# Patient Record
Sex: Male | Born: 1990 | Race: Black or African American | Hispanic: No | Marital: Single | Smoking: Never smoker
Health system: Southern US, Community
[De-identification: ages and names within clinical notes are randomized; demographics above are authoritative.]

## PROBLEM LIST (undated history)

## (undated) DIAGNOSIS — K219 Gastro-esophageal reflux disease without esophagitis: Secondary | ICD-10-CM

## (undated) DIAGNOSIS — F419 Anxiety disorder, unspecified: Secondary | ICD-10-CM

## (undated) DIAGNOSIS — Y249XXA Unspecified firearm discharge, undetermined intent, initial encounter: Secondary | ICD-10-CM

## (undated) DIAGNOSIS — W3400XA Accidental discharge from unspecified firearms or gun, initial encounter: Secondary | ICD-10-CM

## (undated) DIAGNOSIS — F32A Depression, unspecified: Secondary | ICD-10-CM

## (undated) HISTORY — DX: Anxiety disorder, unspecified: F41.9

## (undated) HISTORY — DX: Depression, unspecified: F32.A

## (undated) HISTORY — DX: Gastro-esophageal reflux disease without esophagitis: K21.9

---
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
## 2012-05-11 ENCOUNTER — Encounter (HOSPITAL_COMMUNITY): Payer: Self-pay | Admitting: Emergency Medicine

## 2012-05-11 ENCOUNTER — Emergency Department (HOSPITAL_COMMUNITY): Payer: Self-pay

## 2012-05-11 ENCOUNTER — Emergency Department (HOSPITAL_COMMUNITY)
Admission: EM | Admit: 2012-05-11 | Discharge: 2012-05-11 | Disposition: A | Discharge status: Home / Self Care | Payer: Self-pay | Attending: Emergency Medicine | Admitting: Emergency Medicine

## 2012-05-11 DIAGNOSIS — M545 Low back pain, unspecified: Secondary | ICD-10-CM | POA: Insufficient documentation

## 2012-05-11 DIAGNOSIS — M549 Dorsalgia, unspecified: Secondary | ICD-10-CM

## 2012-05-11 MED ORDER — CYCLOBENZAPRINE HCL 10 MG PO TABS: 10.0000 mg | ORAL_TABLET | Freq: Two times a day (BID) | ORAL | Status: DC | PRN

## 2012-05-11 MED ORDER — HYDROCODONE-ACETAMINOPHEN 5-325 MG PO TABS
1.0000 | ORAL_TABLET | Freq: Once | ORAL | Status: AC
  Administered 2012-05-11: 1 via ORAL
  Filled 2012-05-11: qty 1

## 2012-05-11 MED ORDER — NAPROXEN 500 MG PO TABS: 500.0000 mg | ORAL_TABLET | Freq: Two times a day (BID) | ORAL | Status: DC

## 2012-05-11 MED ORDER — HYDROCODONE-ACETAMINOPHEN 5-325 MG PO TABS: 1.0000 | ORAL_TABLET | Freq: Four times a day (QID) | ORAL | Status: DC | PRN

## 2012-05-11 NOTE — ED Notes (Signed)
Pt c/o lower back pain x 3 days; pt denies obvious injury  
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
## 2012-05-16 ENCOUNTER — Telehealth (HOSPITAL_COMMUNITY): Payer: Self-pay | Admitting: Emergency Medicine

## 2012-07-06 ENCOUNTER — Emergency Department (HOSPITAL_COMMUNITY)
Admission: EM | Admit: 2012-07-06 | Discharge: 2012-07-06 | Disposition: A | Discharge status: Home / Self Care | Payer: No Typology Code available for payment source | Attending: Emergency Medicine | Admitting: Emergency Medicine

## 2012-07-06 ENCOUNTER — Encounter (HOSPITAL_COMMUNITY): Payer: Self-pay | Admitting: Emergency Medicine

## 2012-07-06 ENCOUNTER — Emergency Department (HOSPITAL_COMMUNITY): Payer: No Typology Code available for payment source

## 2012-07-06 DIAGNOSIS — S7000XA Contusion of unspecified hip, initial encounter: Secondary | ICD-10-CM | POA: Insufficient documentation

## 2012-07-06 DIAGNOSIS — S7002XA Contusion of left hip, initial encounter: Secondary | ICD-10-CM

## 2012-07-06 DIAGNOSIS — Y92410 Unspecified street and highway as the place of occurrence of the external cause: Secondary | ICD-10-CM | POA: Insufficient documentation

## 2012-07-06 DIAGNOSIS — S5002XA Contusion of left elbow, initial encounter: Secondary | ICD-10-CM

## 2012-07-06 DIAGNOSIS — S50312A Abrasion of left elbow, initial encounter: Secondary | ICD-10-CM

## 2012-07-06 DIAGNOSIS — IMO0002 Reserved for concepts with insufficient information to code with codable children: Secondary | ICD-10-CM | POA: Insufficient documentation

## 2012-07-06 DIAGNOSIS — S5000XA Contusion of unspecified elbow, initial encounter: Secondary | ICD-10-CM | POA: Insufficient documentation

## 2012-07-06 DIAGNOSIS — S60812A Abrasion of left wrist, initial encounter: Secondary | ICD-10-CM

## 2012-07-06 DIAGNOSIS — Y9389 Activity, other specified: Secondary | ICD-10-CM | POA: Insufficient documentation

## 2012-07-06 MED ORDER — NAPROXEN 500 MG PO TABS: 500.0000 mg | ORAL_TABLET | Freq: Two times a day (BID) | ORAL | Status: DC

## 2012-07-06 MED ORDER — IBUPROFEN 400 MG PO TABS
800.0000 mg | ORAL_TABLET | Freq: Once | ORAL | Status: AC
  Administered 2012-07-06: 800 mg via ORAL
  Filled 2012-07-06: qty 2
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
## 2013-05-04 ENCOUNTER — Encounter (HOSPITAL_COMMUNITY): Payer: Self-pay | Admitting: Emergency Medicine

## 2013-05-04 DIAGNOSIS — R110 Nausea: Secondary | ICD-10-CM | POA: Insufficient documentation

## 2013-05-04 NOTE — ED Notes (Signed)
Pt. reports nausea for 4 days , denies emesis , occasional diarrhea , denies fever or chills.

## 2013-05-05 ENCOUNTER — Emergency Department (HOSPITAL_COMMUNITY)
Admission: EM | Admit: 2013-05-05 | Discharge: 2013-05-05 | Disposition: A | Discharge status: Home / Self Care | Payer: No Typology Code available for payment source | Attending: Emergency Medicine | Admitting: Emergency Medicine

## 2013-05-05 DIAGNOSIS — R110 Nausea: Secondary | ICD-10-CM

## 2013-05-05 LAB — COMPREHENSIVE METABOLIC PANEL
ALT: 19 U/L (ref 0–53)
AST: 17 U/L (ref 0–37)
Albumin: 4.2 g/dL (ref 3.5–5.2)
Alkaline Phosphatase: 55 U/L (ref 39–117)
BUN: 13 mg/dL (ref 6–23)
CALCIUM: 9.2 mg/dL (ref 8.4–10.5)
CO2: 25 meq/L (ref 19–32)
CREATININE: 1.05 mg/dL (ref 0.50–1.35)
Chloride: 101 mEq/L (ref 96–112)
GLUCOSE: 96 mg/dL (ref 70–99)
Potassium: 4.2 mEq/L (ref 3.7–5.3)
Sodium: 140 mEq/L (ref 137–147)
Total Bilirubin: 0.5 mg/dL (ref 0.3–1.2)
Total Protein: 6.9 g/dL (ref 6.0–8.3)

## 2013-05-05 LAB — URINALYSIS, ROUTINE W REFLEX MICROSCOPIC
Bilirubin Urine: NEGATIVE
Glucose, UA: NEGATIVE mg/dL
Hgb urine dipstick: NEGATIVE
Ketones, ur: NEGATIVE mg/dL
NITRITE: NEGATIVE
PROTEIN: NEGATIVE mg/dL
SPECIFIC GRAVITY, URINE: 1.02 (ref 1.005–1.030)
UROBILINOGEN UA: 0.2 mg/dL (ref 0.0–1.0)
pH: 6 (ref 5.0–8.0)

## 2013-05-05 LAB — URINE MICROSCOPIC-ADD ON

## 2013-05-05 LAB — CBC WITH DIFFERENTIAL/PLATELET
BASOS ABS: 0 10*3/uL (ref 0.0–0.1)
BASOS PCT: 0 % (ref 0–1)
Eosinophils Absolute: 0.2 10*3/uL (ref 0.0–0.7)
Eosinophils Relative: 2 % (ref 0–5)
HEMATOCRIT: 41.9 % (ref 39.0–52.0)
Hemoglobin: 15 g/dL (ref 13.0–17.0)
LYMPHS ABS: 3 10*3/uL (ref 0.7–4.0)
Lymphocytes Relative: 32 % (ref 12–46)
MCH: 32.3 pg (ref 26.0–34.0)
MCHC: 35.8 g/dL (ref 30.0–36.0)
MCV: 90.3 fL (ref 78.0–100.0)
MONOS PCT: 6 % (ref 3–12)
Monocytes Absolute: 0.6 10*3/uL (ref 0.1–1.0)
NEUTROS ABS: 5.5 10*3/uL (ref 1.7–7.7)
Neutrophils Relative %: 60 % (ref 43–77)
Platelets: 152 10*3/uL (ref 150–400)
RBC: 4.64 MIL/uL (ref 4.22–5.81)
RDW: 12.9 % (ref 11.5–15.5)
WBC: 9.3 10*3/uL (ref 4.0–10.5)

## 2013-05-05 MED ORDER — SODIUM CHLORIDE 0.9 % IV SOLN
1000.0000 mL | Freq: Once | INTRAVENOUS | Status: AC
  Administered 2013-05-05: 1000 mL via INTRAVENOUS

## 2013-05-05 MED ORDER — ONDANSETRON HCL 4 MG/2ML IJ SOLN
4.0000 mg | Freq: Once | INTRAMUSCULAR | Status: AC
  Administered 2013-05-05: 4 mg via INTRAVENOUS
  Filled 2013-05-05: qty 2

## 2013-05-05 MED ORDER — SODIUM CHLORIDE 0.9 % IV SOLN
1000.0000 mL | INTRAVENOUS | Status: DC
  Administered 2013-05-05: 1000 mL via INTRAVENOUS

## 2013-05-05 MED ORDER — METOCLOPRAMIDE HCL 10 MG PO TABS: 10.0000 mg | ORAL_TABLET | Freq: Four times a day (QID) | ORAL | Status: DC | PRN

## 2013-05-05 NOTE — ED Provider Notes (Signed)
CSN: [id number]     Arrival date & time 05/04/13  2326 History   First MD Initiated Contact with Patient 05/05/13 0130     Chief Complaint  Patient presents with  . Nausea   (Consider location/radiation/quality/duration/timing/severity/associated sxs/prior Treatment) The history is provided by the patient.   23 year old male complains of nausea for the last 4 days. Appetite has been normal and he has not vomited. He has had some loose bowel movements. He states his urine has been darker than normal. He denies fever, chills, sweats. He denies any abdominal pain or myalgias. He may have had some sick contacts. He has not done anything to treat his symptoms. Nothing makes it better and nothing makes it worse.  History reviewed. No pertinent past medical history. History reviewed. No pertinent past surgical history. No family history on file. History  Substance Use Topics  . Smoking status: Never Smoker   . Smokeless tobacco: Not on file  . Alcohol Use: Yes     Comment: occ    Review of Systems  All other systems reviewed and are negative.    Allergies  Review of patient's allergies indicates no known allergies.  Home Medications   Current Outpatient Rx  Name  Route  Sig  Dispense  Refill  . Aspirin-Salicylamide-Caffeine (BC HEADACHE POWDER PO)   Oral   Take 1 Package by mouth every 8 (eight) hours as needed (pain).          BP 118/68  Pulse 55  Temp(Src) 97.4 F (36.3 C) (Oral)  Resp 16  SpO2 100% Physical Exam  Nursing note and vitals reviewed.  23 year old male, resting comfortably and in no acute distress. Vital signs are significant for bradycardia with heart rate 55. Oxygen saturation is 100%, which is normal. Head is normocephalic and atraumatic. PERRLA, EOMI. Oropharynx is clear. There is no scleral icterus. Neck is nontender and supple without adenopathy or JVD. Back is nontender and there is no CVA tenderness. Lungs are clear without rales, wheezes, or  rhonchi. Chest is nontender. Heart has regular rate and rhythm without murmur. Abdomen is soft, flat, nontender without masses or hepatosplenomegaly and peristalsis is normoactive. Extremities have no cyanosis or edema, full range of motion is present. Skin is warm and dry without rash. Neurologic: Mental status is normal, cranial nerves are intact, there are no motor or sensory deficits.  ED Course  Procedures (including critical care time) Labs Review Results for orders placed during the hospital encounter of 05/05/13  CBC WITH DIFFERENTIAL      Result Value Range   WBC 9.3  4.0 - 10.5 K/uL   RBC 4.64  4.22 - 5.81 MIL/uL   Hemoglobin 15.0  13.0 - 17.0 g/dL   HCT 23.741.9  62.839.0 - 31.552.0 %   MCV 90.3  78.0 - 100.0 fL   MCH 32.3  26.0 - 34.0 pg   MCHC 35.8  30.0 - 36.0 g/dL   RDW 17.612.9  16.011.5 - 73.715.5 %   Platelets 152  150 - 400 K/uL   Neutrophils Relative % 60  43 - 77 %   Lymphocytes Relative 32  12 - 46 %   Monocytes Relative 6  3 - 12 %   Eosinophils Relative 2  0 - 5 %   Basophils Relative 0  0 - 1 %   Neutro Abs 5.5  1.7 - 7.7 K/uL   Lymphs Abs 3.0  0.7 - 4.0 K/uL   Monocytes Absolute 0.6  0.1 -  1.0 K/uL   Eosinophils Absolute 0.2  0.0 - 0.7 K/uL   Basophils Absolute 0.0  0.0 - 0.1 K/uL  COMPREHENSIVE METABOLIC PANEL      Result Value Range   Sodium 140  137 - 147 mEq/L   Potassium 4.2  3.7 - 5.3 mEq/L   Chloride 101  96 - 112 mEq/L   CO2 25  19 - 32 mEq/L   Glucose, Bld 96  70 - 99 mg/dL   BUN 13  6 - 23 mg/dL   Creatinine, Ser 4.09  0.50 - 1.35 mg/dL   Calcium 9.2  8.4 - 81.1 mg/dL   Total Protein 6.9  6.0 - 8.3 g/dL   Albumin 4.2  3.5 - 5.2 g/dL   AST 17  0 - 37 U/L   ALT 19  0 - 53 U/L   Alkaline Phosphatase 55  39 - 117 U/L   Total Bilirubin 0.5  0.3 - 1.2 mg/dL   GFR calc non Af Amer >90  >90 mL/min   GFR calc Af Amer >90  >90 mL/min  URINALYSIS, ROUTINE W REFLEX MICROSCOPIC      Result Value Range   Color, Urine YELLOW  YELLOW   APPearance CLEAR  CLEAR    Specific Gravity, Urine 1.020  1.005 - 1.030   pH 6.0  5.0 - 8.0   Glucose, UA NEGATIVE  NEGATIVE mg/dL   Hgb urine dipstick NEGATIVE  NEGATIVE   Bilirubin Urine NEGATIVE  NEGATIVE   Ketones, ur NEGATIVE  NEGATIVE mg/dL   Protein, ur NEGATIVE  NEGATIVE mg/dL   Urobilinogen, UA 0.2  0.0 - 1.0 mg/dL   Nitrite NEGATIVE  NEGATIVE   Leukocytes, UA TRACE (*) NEGATIVE  URINE MICROSCOPIC-ADD ON      Result Value Range   Squamous Epithelial / LPF RARE  RARE   WBC, UA 7-10  <3 WBC/hpf   RBC / HPF 0-2  <3 RBC/hpf   Bacteria, UA FEW (*) RARE   MDM   1. Nausea    Nausea of uncertain cause. CBC and complete metabolic panel are normal. He'll be given IV fluids and IV ondansetron and urinalysis will be checked.  Urinalysis shows very questionable urinary tract infection. There is slight pyuria with a few bacteria. In the absence of urinary symptoms, I will wait for culture read for it to decide whether he should go on antibiotics. He is discharged with prescription for metoclopramide for nausea.  Dione Booze, MD 05/05/13 [phone]

## 2013-05-05 NOTE — ED Notes (Signed)
C/o nausea x 3-4 days, + diarrhea 3 times daily x 2 days.  Urine dark in color despite drinking water.  Denies pain, just nausea.

## 2013-05-05 NOTE — ED Notes (Signed)
Pt up to BR to void; feeling much improved.

## 2013-05-05 NOTE — ED Notes (Signed)
Pt back to bed.  Much improved.  Ready to go home.

## 2013-05-05 NOTE — Discharge Instructions (Signed)
Your urine showed a possible infection. A culture will be completed in two days - if it shows you need to be on an antibiotic, we will contact you then.  Nausea, Adult Nausea is the feeling that you have an upset stomach or have to vomit. Nausea by itself is not likely a serious concern, but it may be an early sign of more serious medical problems. As nausea gets worse, it can lead to vomiting. If vomiting develops, there is the risk of dehydration.  CAUSES   Viral infections.  Food poisoning.  Medicines.  Pregnancy.  Motion sickness.  Migraine headaches.  Emotional distress.  Severe pain from any source.  Alcohol intoxication. HOME CARE INSTRUCTIONS  Get plenty of rest.  Ask your caregiver about specific rehydration instructions.  Eat small amounts of food and sip liquids more often.  Take all medicines as told by your caregiver. SEEK MEDICAL CARE IF:  You have not improved after 2 days, or you get worse.  You have a headache. SEEK IMMEDIATE MEDICAL CARE IF:   You have a fever.  You faint.  You keep vomiting or have blood in your vomit.  You are extremely weak or dehydrated.  You have dark or bloody stools.  You have severe chest or abdominal pain. MAKE SURE YOU:  Understand these instructions.  Will watch your condition.  Will get help right away if you are not doing well or get worse. Document Released: 05/02/2004 Document Revised: 12/18/2011 Document Reviewed: 12/05/2010 [facility] Patient Information 2014 [place], [place].  Metoclopramide tablets What is this medicine? METOCLOPRAMIDE (met oh kloe PRA mide) is used to treat the symptoms of gastroesophageal reflux disease (GERD) like heartburn. It is also used to treat people with slow emptying of the stomach and intestinal tract. This medicine may be used for other purposes; ask your health care provider or pharmacist if you have questions. COMMON BRAND NAME(S): Reglan What should I tell my health  care provider before I take this medicine? They need to know if you have any of these conditions: -breast cancer -depression -diabetes -heart failure -high blood pressure -kidney disease -liver disease -Parkinson's disease or a movement disorder -pheochromocytoma -seizures -stomach obstruction, bleeding, or perforation -an unusual or allergic reaction to metoclopramide, procainamide, sulfites, other medicines, foods, dyes, or preservatives -pregnant or trying to get pregnant -breast-feeding How should I use this medicine? Take this medicine by mouth with a glass of water. Follow the directions on the prescription label. Take this medicine on an empty stomach, about 30 minutes before eating. Take your doses at regular intervals. Do not take your medicine more often than directed. Do not stop taking except on the advice of your doctor or health care professional. A special MedGuide will be given to you by the pharmacist with each prescription and refill. Be sure to read this information carefully each time. Talk to your pediatrician regarding the use of this medicine in children. Special care may be needed. Overdosage: If you think you have taken too much of this medicine contact a poison control center or emergency room at once. NOTE: This medicine is only for you. Do not share this medicine with others. What if I miss a dose? If you miss a dose, take it as soon as you can. If it is almost time for your next dose, take only that dose. Do not take double or extra doses. What may interact with this medicine? -acetaminophen -cyclosporine -digoxin -medicines for blood pressure -medicines for diabetes, including insulin -medicines  for hay fever and other allergies -medicines for depression, especially an Monoamine Oxidase Inhibitor (MAOI) -medicines for Parkinson's disease, like levodopa -medicines for sleep or for pain -tetracycline This list may not describe all possible interactions.  Give your health care provider a list of all the medicines, herbs, non-prescription drugs, or dietary supplements you use. Also tell them if you smoke, drink alcohol, or use illegal drugs. Some items may interact with your medicine. What should I watch for while using this medicine? It may take a few weeks for your stomach condition to start to get better. However, do not take this medicine for longer than 12 weeks. The longer you take this medicine, and the more you take it, the greater your chances are of developing serious side effects. If you are an elderly patient, a male patient, or you have diabetes, you may be at an increased risk for side effects from this medicine. Contact your doctor immediately if you start having movements you cannot control such as lip smacking, rapid movements of the tongue, involuntary or uncontrollable movements of the eyes, head, arms and legs, or muscle twitches and spasms. Patients and their families should watch out for worsening depression or thoughts of suicide. Also watch out for any sudden or severe changes in feelings such as feeling anxious, agitated, panicky, irritable, hostile, aggressive, impulsive, severely restless, overly excited and hyperactive, or not being able to sleep. If this happens, especially at the beginning of treatment or after a change in dose, call your doctor. Do not treat yourself for high fever. Ask your doctor or health care professional for advice. You may get drowsy or dizzy. Do not drive, use machinery, or do anything that needs mental alertness until you know how this drug affects you. Do not stand or sit up quickly, especially if you are an older patient. This reduces the risk of dizzy or fainting spells. Alcohol can make you more drowsy and dizzy. Avoid alcoholic drinks. What side effects may I notice from receiving this medicine? Side effects that you should report to your doctor or health care professional as soon as  possible: -allergic reactions like skin rash, itching or hives, swelling of the face, lips, or tongue -abnormal production of milk in females -breast enlargement in both males and females -change in the way you walk -difficulty moving, speaking or swallowing -drooling, lip smacking, or rapid movements of the tongue -excessive sweating -fever -involuntary or uncontrollable movements of the eyes, head, arms and legs -irregular heartbeat or palpitations -muscle twitches and spasms -unusually weak or tired Side effects that usually do not require medical attention (report to your doctor or health care professional if they continue or are bothersome): -change in sex drive or performance -depressed mood -diarrhea -difficulty sleeping -headache -menstrual changes -restless or nervous This list may not describe all possible side effects. Call your doctor for medical advice about side effects. You may report side effects to FDA at 1-800-FDA-1088. Where should I keep my medicine? Keep out of the reach of children. Store at room temperature between 20 and 25 degrees C (68 and 77 degrees F). Protect from light. Keep container tightly closed. Throw away any unused medicine after the expiration date. NOTE: This sheet is a summary. It may not cover all possible information. If you have questions about this medicine, talk to your doctor, pharmacist, or health care provider.  2014, [employer]. (2011-07-23 13:04:38)

## 2013-05-06 LAB — URINE CULTURE
Colony Count: NO GROWTH
Culture: NO GROWTH

## 2013-07-01 ENCOUNTER — Encounter (HOSPITAL_COMMUNITY): Payer: Self-pay | Admitting: Emergency Medicine

## 2013-07-01 ENCOUNTER — Emergency Department (HOSPITAL_COMMUNITY)
Admission: EM | Admit: 2013-07-01 | Discharge: 2013-07-01 | Disposition: A | Discharge status: Home / Self Care | Payer: No Typology Code available for payment source | Attending: Emergency Medicine | Admitting: Emergency Medicine

## 2013-07-01 DIAGNOSIS — N390 Urinary tract infection, site not specified: Secondary | ICD-10-CM | POA: Insufficient documentation

## 2013-07-01 DIAGNOSIS — R109 Unspecified abdominal pain: Secondary | ICD-10-CM

## 2013-07-01 LAB — URINALYSIS, ROUTINE W REFLEX MICROSCOPIC
BILIRUBIN URINE: NEGATIVE
GLUCOSE, UA: NEGATIVE mg/dL
HGB URINE DIPSTICK: NEGATIVE
KETONES UR: NEGATIVE mg/dL
Nitrite: NEGATIVE
PH: 6.5 (ref 5.0–8.0)
PROTEIN: NEGATIVE mg/dL
Specific Gravity, Urine: 1.016 (ref 1.005–1.030)
Urobilinogen, UA: 0.2 mg/dL (ref 0.0–1.0)

## 2013-07-01 LAB — COMPREHENSIVE METABOLIC PANEL
ALBUMIN: 4.4 g/dL (ref 3.5–5.2)
ALK PHOS: 57 U/L (ref 39–117)
ALT: 21 U/L (ref 0–53)
AST: 20 U/L (ref 0–37)
BILIRUBIN TOTAL: 0.5 mg/dL (ref 0.3–1.2)
BUN: 12 mg/dL (ref 6–23)
CHLORIDE: 101 meq/L (ref 96–112)
CO2: 28 mEq/L (ref 19–32)
Calcium: 9.8 mg/dL (ref 8.4–10.5)
Creatinine, Ser: 1.03 mg/dL (ref 0.50–1.35)
GFR calc Af Amer: >90 mL/min (ref >90)
GFR calc non Af Amer: >90 mL/min (ref >90)
Glucose, Bld: 94 mg/dL (ref 70–99)
POTASSIUM: 4.6 meq/L (ref 3.7–5.3)
SODIUM: 142 meq/L (ref 137–147)
TOTAL PROTEIN: 7.2 g/dL (ref 6.0–8.3)

## 2013-07-01 LAB — URINE MICROSCOPIC-ADD ON

## 2013-07-01 LAB — CBC WITH DIFFERENTIAL/PLATELET
BASOS PCT: 0 % (ref 0–1)
Basophils Absolute: 0 10*3/uL (ref 0.0–0.1)
EOS ABS: 0.1 10*3/uL (ref 0.0–0.7)
Eosinophils Relative: 1 % (ref 0–5)
HCT: 41.7 % (ref 39.0–52.0)
HEMOGLOBIN: 15 g/dL (ref 13.0–17.0)
LYMPHS ABS: 2.5 10*3/uL (ref 0.7–4.0)
Lymphocytes Relative: 31 % (ref 12–46)
MCH: 32.4 pg (ref 26.0–34.0)
MCHC: 36 g/dL (ref 30.0–36.0)
MCV: 90.1 fL (ref 78.0–100.0)
MONOS PCT: 7 % (ref 3–12)
Monocytes Absolute: 0.5 10*3/uL (ref 0.1–1.0)
NEUTROS ABS: 4.8 10*3/uL (ref 1.7–7.7)
NEUTROS PCT: 61 % (ref 43–77)
PLATELETS: 169 10*3/uL (ref 150–400)
RBC: 4.63 MIL/uL (ref 4.22–5.81)
RDW: 12.8 % (ref 11.5–15.5)
WBC: 7.9 10*3/uL (ref 4.0–10.5)

## 2013-07-01 MED ORDER — PANTOPRAZOLE SODIUM 20 MG PO TBEC: 20.0000 mg | DELAYED_RELEASE_TABLET | Freq: Every day | ORAL | Status: DC

## 2013-07-01 MED ORDER — PANTOPRAZOLE SODIUM 40 MG PO TBEC
40.0000 mg | DELAYED_RELEASE_TABLET | Freq: Once | ORAL | Status: AC
  Administered 2013-07-01: 40 mg via ORAL
  Filled 2013-07-01: qty 1

## 2013-07-01 MED ORDER — DICYCLOMINE HCL 20 MG PO TABS: 20.0000 mg | ORAL_TABLET | Freq: Two times a day (BID) | ORAL | Status: DC

## 2013-07-01 MED ORDER — CEPHALEXIN 500 MG PO CAPS: ORAL_CAPSULE | ORAL | Status: DC

## 2013-07-01 MED ORDER — DICYCLOMINE HCL 10 MG PO CAPS
10.0000 mg | ORAL_CAPSULE | Freq: Once | ORAL | Status: AC
  Administered 2013-07-01: 10 mg via ORAL
  Filled 2013-07-01: qty 1

## 2013-07-01 NOTE — Discharge Instructions (Signed)
Abdominal Pain, Adult Many things can cause belly (abdominal) pain. Most times, the belly pain is not dangerous. Many cases of belly pain can be watched and treated at home. HOME CARE   Do not take medicines that help you go poop (laxatives) unless told to by your doctor.  Only take medicine as told by your doctor.  Eat or drink as told by your doctor. Your doctor will tell you if you should be on a special diet. GET HELP IF:  You do not know what is causing your belly pain.  You have belly pain while you are sick to your stomach (nauseous) or have runny poop (diarrhea).  You have pain while you pee or poop.  Your belly pain wakes you up at night.  You have belly pain that gets worse or better when you eat.  You have belly pain that gets worse when you eat fatty foods. GET HELP RIGHT AWAY IF:   The pain does not go away within 2 hours.  You have a fever.  You keep throwing up (vomiting).  The pain changes and is only in the right or left part of the belly.  You have bloody or tarry looking poop. MAKE SURE YOU:   Understand these instructions.  Will watch your condition.  Will get help right away if you are not doing well or get worse. Document Released: 09/11/2007 Document Revised: 01/13/2013 Document Reviewed: 12/02/2012 [facility] Patient Information 2014 [place]Care, [place]LLC.  Urinary Tract Infection A urinary tract infection (UTI) can occur any place along the urinary tract. The tract includes the kidneys, ureters, bladder, and urethra. A type of germ called bacteria often causes a UTI. UTIs are often helped with antibiotic medicine.  HOME CARE   If given, take antibiotics as told by your doctor. Finish them even if you start to feel better.  Drink enough fluids to keep your pee (urine) clear or pale yellow.  Avoid tea, drinks with caffeine, and bubbly (carbonated) drinks.  Pee often. Avoid holding your pee in for a long time.  Pee before and after having sex  (intercourse).  Wipe from front to back after you poop (bowel movement) if you are a woman. Use each tissue only once. GET HELP RIGHT AWAY IF:   You have back pain.  You have lower belly (abdominal) pain.  You have chills.  You feel sick to your stomach (nauseous).  You throw up (vomit).  Your burning or discomfort with peeing does not go away.  You have a fever.  Your symptoms are not better in 3 days. MAKE SURE YOU:   Understand these instructions.  Will watch your condition.  Will get help right away if you are not doing well or get worse. Document Released: 09/11/2007 Document Revised: 12/18/2011 Document Reviewed: 10/24/2011 [facility] Patient Information 2014 [place]ExitCare, [place]LLC.   Emergency Department Resource Guide 1) Find a Doctor and Pay Out of Pocket Although you won't have to find out who is covered by your insurance plan, it is a good idea to ask around and get recommendations. You will then need to call the office and see if the doctor you have chosen will accept you as a new patient and what types of options they offer for patients who are self-pay. Some doctors offer discounts or will set up payment plans for their patients who do not have insurance, but you will need to ask so you aren't surprised when you get to your appointment.  2) Contact Your Local Health  Department Not all health departments have doctors that can see patients for sick visits, but many do, so it is worth a call to see if yours does. If you don't know where your local health department is, you can check in your phone book. The CDC also has a tool to help you locate your state's health department, and many state websites also have listings of all of their local health departments.  3) Find a Walk-in Clinic If your illness is not likely to be very severe or complicated, you may want to try a walk in clinic. These are popping up all over the country in pharmacies, drugstores, and shopping centers.  They're usually staffed by nurse practitioners or physician assistants that have been trained to treat common illnesses and complaints. They're usually fairly quick and inexpensive. However, if you have serious medical issues or chronic medical problems, these are probably not your best option.  No Primary Care Doctor: - Call Health Connect at  [phone] - they can help you locate a primary care doctor that  accepts your insurance, provides certain services, etc. - Physician Referral Service- [phone]  Chronic Pain Problems: Organization         Address  Phone   Notes  [facility]  [phone] Patients need to be referred by their primary care doctor.   Medication Assistance: Organization         Address  Phone   Notes  [facility] Medication [facility] [address] [place] [place], [place] [address] [phone] --Must be a resident of [facility] -- Must have NO insurance coverage whatsoever (no Medicaid/ Medicare, etc.) -- The pt. MUST have a primary care doctor that directs their care regularly and follows them in the community   MedAssist  [phone]   [employer]  [phone]    Agencies that provide inexpensive medical care: Organization         Address  Phone   Notes  [facility]  [phone]   [facility]    [phone]   [facility] [address] [place], [place] [address] [phone]   [facility] [address] [place] [place] [phone]   Planned Parenthood    [phone]   [facility]    [phone]   [facility]  [address], [place] Phone:  [phone], Fax:  [phone] Hours of Operation:  9 am - 6 pm, M-F.  Also accepts Medicaid/Medicare and self-pay.  [facility]  [address], [place] Phone: [phone], Fax: [phone]. Hours of Operation:  8:30 am - 5:30 pm, M-F.  Also accepts Medicaid and self-pay.  [facility] [place] [address] [place] Phone: [phone]   [facility] [address] Natasha Bence [place], [place] [phone], Ext. [phone] Mondays & Thursdays: 7-9 AM.  First 15 patients are seen on a first come, first serve basis.    Medicaid-accepting [facility] Providers:  Organization         Address  Phone   Notes  [facility] [address], [place] [phone] Also accepts self-pay patients.  [facility] [address] Laurell Josephs [place], [place]  [phone]   [facility] [address], [place] [phone]  [facility] [address] [place] [phone]   Renaye Rakers [address], [place]   [phone] Only accepts [place] patients after they have their name applied to their card.   Self-Pay (no insurance) in [facility]:  Organization         Address  Phone   Notes  Sickle Cell Patients, [facility] [address] [place], [place] [phone]   [facility] [address] [place], [place] [phone]   [facility]  [address], [place] [phone]   [facility]/Dr. Osei-Bonsu  [address], [place] or [address], [place] [phone] Phone number for both [place] and [place] locations is the same.  [facility] [address] [place] [phone]   [facility] [address], [place] or [address] [phone]   [facility] [address], [place] [phone], phone; [phone], fax Sees patients 1st and 3rd Saturday of every month.  Must not qualify for public or private insurance (i.e.  Medicaid, Medicare, [place] Health Choice, Veterans' Benefits)  Household income should be no more than 200% of the poverty level The clinic cannot treat you if you are pregnant or think you are pregnant  Sexually transmitted diseases are not treated at the clinic.    Dental Care: Organization         Address  Phone  Notes  [facility] [address] [place], [place] [phone] Accepts children up to age 60 who are enrolled in [place] or [place] Health Choice; pregnant women with a Medicaid card; and children who have applied for Medicaid or [place] Health Choice, but were declined, whose parents can pay a reduced fee at time of service.  [facility] of [facility]  [address], [place] [phone] Accepts children up to age 45 who are enrolled in [place] or [place] Health Choice; pregnant women with a Medicaid card; and children who have applied for Medicaid or [place] Health Choice, but were declined, whose parents can pay a reduced fee at time of service.  [facility]  [address] [place], [place] [phone] Patients are seen by appointment only. Walk-ins are not accepted. [facility] will see patients 62 years of age and older. Monday - Tuesday (8am-5pm) Most Wednesdays (8:30-5pm) $30 per visit, cash only  [facility]  [address], [facility] [phone] Patients are seen by appointment only. Walk-ins are not accepted. [facility] will see patients 62 years of age and older. One Wednesday Evening (Monthly: Volunteer Based).  $30 per visit, cash only  [employer]  [phone] for adults; Children under age 62, call Graduate Pediatric Dentistry at [phone]. Children aged 66-14, please call [phone] to request a pediatric application.  Dental services are provided in all areas of dental care including fillings,  crowns and bridges, complete and partial dentures, implants, gum treatment, root canals, and extractions. Preventive care is also provided. Treatment is provided to both adults and children. Patients are selected via a lottery and there is often a waiting list.   [facility] [address], [place]  [phone] [email or web address]  [facility] [address], [place], [place] [phone], Ext. [phone] Second and Fourth Thursday of each month, opens at 6:30 AM; Clinic ends at 9 AM.  Patients are seen on a first-come first-served basis, and a limited number are seen during each clinic.   [facility]  [address], [place], [place] [phone]   Eligibility Requirements You must have lived in [place], [place], or [place] counties for at least the last three months.   You cannot be eligible for state or federal sponsored National Cityhealthcare insurance, including CIGNAVeterans Administration, [place]Medicaid, or [employer] EntertainmentMedicare.   You generally cannot be eligible for healthcare insurance through your employer.    How to apply: Eligibility screenings are held every Tuesday and Wednesday afternoon from 1:00 pm until 4:00 pm. You do not need an appointment for the interview!  [facility] [address], [place], [place] [phone]   [facility]  [phone]   [facility]  [phone]   [facility]  [phone]    Behavioral Health Resources in the Community: Intensive Outpatient Programs Organization         Address  Phone  Notes  [facility] [address], [place], [place] [phone]   [facility] Outpatient [address], [place], [place] [phone]   [facility] [address], [place], [place]  [phone]   [facility] [address],  [place], [place] [phone] or [phone]   Substance Abuse  Resources Organization         Address  Phone  Notes  [facility]  [phone]   [facility]  [phone]   [facility]  [phone]   [facility]  [phone]   Residential & Outpatient Substance Abuse Program  [phone]   Psychological Services Organization         Address  Phone  Notes  [facility]  [phone]- [phone]   [facility]  [phone]   [facility] [address], [place] [phone] or [phone]    Mobile Crisis Teams Organization         Address  Phone  Notes  [facility], Mobile Crisis Care Unit  [phone]   [facility]  [address] [place], [place] [phone]   Doristine LocksSharon DeEsch [address] [place] [phone]    Self-Help/Support Groups Organization         Address  Phone             Notes  [facility] [place] - variety of support groups  336- [phone] Call for more information  Narcotics Anonymous (NA), [facility]aring [facility] [address], [place]  2 meetings at this location   Statisticianesidential Treatment Programs Organization         Address  Phone  Notes  [facility] [address],    [place]  [phone]   [facility]  [address], [place] [phone], [place], [place] [phone]   [facility] [address], [place] [phone] Admissions: 8am-3pm M-F  [facility] [address]    [place], [place] [phone]   [facility] [address], [place], [place] [phone]   [facility] [address]  [place], [place] [phone]   [facility] - Intensive Outpatient [phone]  [address], [place], [place] [phone]   [facility] ([facility] [address]  [place], [place] [phone] or [phone]   [facility] ([facility]) [address] [place], [place] [phone] Accepts Medicaid  [facility] [address]  [place] [phone] Substance Abuse/Addiction Treatment   [facility] Resources Organization         Address  Phone  Notes  [facility]  [phone]) [phone]   Angie FavaJulie Brannon, PhD [address], [address] [place], [place]   [phone](336) [phone] or [phone](336) [phone]   [facility]   [address] [place], [place] [phone](336) [phone]   [facility] [address], [place], [place] [phone] Insurance/Medicaid/sponsorship through [facility]  [facility] [address]                                    [place], [place] [phone] Therapy/tele-psych/case  [facility] [address].   [place], [place] [phone]) [phone]    Dr. Lolly MustacheArfeen  [phone]   [facility]  [employer] [facility] 1) [address], [place] 2) [address], [place] 3)  [address], [place] [phone]) [phone]  [phone]   [facility] Child Abuse Hotline [phone] or [phone]) [phone] (After Hours)

## 2013-07-01 NOTE — ED Provider Notes (Signed)
CSN: [id number]     Arrival date & time 07/01/13  1804 History   First MD Initiated Contact with Patient 07/01/13 2011     Chief Complaint  Patient presents with  . Abdominal Pain     (Consider location/radiation/quality/duration/timing/severity/associated sxs/prior Treatment) HPI Comments: Patient is a 23 year old male presents today with 4 months of abdominal pain. He reports that it is a burning sensation worse early in the morning in his epigastric area. The pain does not radiate. He has associated nausea without vomiting. He denies any diarrhea. He was given a medication at the end of January which helped. He believes this medication was naproxen, however it appears in his chart that it was Reglan. He does not believe that food triggers his pain. The pain does seem to be worse when he is hungry. He denies any black or tarry stools. No bright red blood per rectum. He has associated burning with urination. He denies urgency frequency. No fevers, chills, shortness of breath, chest pain.   The history is provided by the patient. No language interpreter was used.    History reviewed. No pertinent past medical history. History reviewed. No pertinent past surgical history. History reviewed. No pertinent family history. History  Substance Use Topics  . Smoking status: Never Smoker   . Smokeless tobacco: Not on file  . Alcohol Use: Yes     Comment: occ    Review of Systems  Constitutional: Negative for fever and chills.  Respiratory: Negative for shortness of breath.   Cardiovascular: Negative for chest pain.  Gastrointestinal: Positive for nausea and abdominal pain. Negative for vomiting and diarrhea.  Genitourinary: Positive for dysuria. Negative for urgency and frequency.  All other systems reviewed and are negative.      Allergies  Review of patient's allergies indicates no known allergies.  Home Medications  No current outpatient prescriptions on file. BP 105/59  Pulse 70   Temp(Src) 98.6 F (37 C) (Oral)  Resp 18  SpO2 99% Physical Exam  Nursing note and vitals reviewed. Constitutional: He is oriented to person, place, and time. He appears well-developed and well-nourished.  Non-toxic appearance. He does not have a sickly appearance. He does not appear ill. No distress.  Very well appearing, in no distress.   HENT:  Head: Normocephalic and atraumatic.  Right Ear: External ear normal.  Left Ear: External ear normal.  Nose: Nose normal.  Eyes: Conjunctivae are normal.  Neck: Normal range of motion. No tracheal deviation present.  Cardiovascular: Normal rate, regular rhythm and normal heart sounds.   Pulmonary/Chest: Effort normal and breath sounds normal. No stridor.  Abdominal: Soft. Bowel sounds are normal. He exhibits no distension. There is tenderness in the epigastric area. There is no rigidity, no rebound and no guarding.  Mild epigastric tenderness to deep palpation.   Musculoskeletal: Normal range of motion.  Neurological: He is alert and oriented to person, place, and time.  Skin: Skin is warm and dry. He is not diaphoretic.  Psychiatric: He has a normal mood and affect. His behavior is normal.    ED Course  Procedures (including critical care time) Labs Review Labs Reviewed  URINALYSIS, ROUTINE W REFLEX MICROSCOPIC - Abnormal; Notable for the following:    Leukocytes, UA SMALL (*)    All other components within normal limits  CBC WITH DIFFERENTIAL  COMPREHENSIVE METABOLIC PANEL  URINE MICROSCOPIC-ADD ON   Imaging Review No results found.   EKG Interpretation None      MDM   Final  diagnoses:  Abdominal pain  UTI (lower urinary tract infection)    Patient is an otherwise healthy 23 year old male who presents today for evaluation of 4 months of mild epigastric pain. Patient has mild tenderness to deep palpation today. He is very well appearing. No bloody or black tarry stools. No concern for bleeding PUD or perforation. No  concern for cholecystitis. Abd is soft and non surgical. Patient was given bentyl and protonix. Given resource guide to follow up with PCP. Also given GI follow up. Return instructions given. Vital signs stable for discharge. Patient has dysuria and 7-10 white blood cells seen on UA. Will treat for early UTI. Afebrile, no CVA tenderness. Patient / Family / Caregiver informed of clinical course, understand medical decision-making process, and agree with plan.    Mora BellmanHannah S Nyazia Canevari, PA-C 07/01/13 2201

## 2013-07-01 NOTE — ED Notes (Signed)
Pt reports having abd pain, cramping and nausea, denies vomiting or diarrhea. Has been seen here in past for same, states he was given a medicine that only temporarily relieved the pain. No acute distress noted at triage.

## 2013-07-04 NOTE — ED Provider Notes (Signed)
Medical screening examination/treatment/procedure(s) were performed by non-physician practitioner and as supervising physician I was immediately available for consultation/collaboration.   EKG Interpretation None        Candyce ChurnJohn David Mishell Donalson III, MD 07/04/13 1248

## 2013-09-21 ENCOUNTER — Emergency Department (HOSPITAL_COMMUNITY)
Admission: EM | Admit: 2013-09-21 | Discharge: 2013-09-21 | Disposition: A | Discharge status: Home / Self Care | Payer: Self-pay | Attending: Emergency Medicine | Admitting: Emergency Medicine

## 2013-09-21 ENCOUNTER — Emergency Department (HOSPITAL_COMMUNITY): Payer: Self-pay

## 2013-09-21 ENCOUNTER — Encounter (HOSPITAL_COMMUNITY): Payer: Self-pay | Admitting: Emergency Medicine

## 2013-09-21 DIAGNOSIS — R05 Cough: Secondary | ICD-10-CM | POA: Insufficient documentation

## 2013-09-21 DIAGNOSIS — R079 Chest pain, unspecified: Secondary | ICD-10-CM | POA: Insufficient documentation

## 2013-09-21 DIAGNOSIS — R059 Cough, unspecified: Secondary | ICD-10-CM | POA: Insufficient documentation

## 2013-09-21 LAB — BASIC METABOLIC PANEL
BUN: 11 mg/dL (ref 6–23)
CO2: 27 mEq/L (ref 19–32)
Calcium: 9.3 mg/dL (ref 8.4–10.5)
Chloride: 103 mEq/L (ref 96–112)
Creatinine, Ser: 1.04 mg/dL (ref 0.50–1.35)
GFR calc Af Amer: >90 mL/min (ref >90)
GFR calc non Af Amer: >90 mL/min (ref >90)
Glucose, Bld: 100 mg/dL — ABNORMAL HIGH (ref 70–99)
Potassium: 3.9 mEq/L (ref 3.7–5.3)
Sodium: 142 mEq/L (ref 137–147)

## 2013-09-21 LAB — CBC WITH DIFFERENTIAL/PLATELET
BASOS ABS: 0 10*3/uL (ref 0.0–0.1)
Basophils Relative: 0 % (ref 0–1)
Eosinophils Absolute: 0.2 10*3/uL (ref 0.0–0.7)
Eosinophils Relative: 2 % (ref 0–5)
HEMATOCRIT: 41.8 % (ref 39.0–52.0)
HEMOGLOBIN: 14.5 g/dL (ref 13.0–17.0)
LYMPHS ABS: 1.3 10*3/uL (ref 0.7–4.0)
Lymphocytes Relative: 10 % — ABNORMAL LOW (ref 12–46)
MCH: 31.6 pg (ref 26.0–34.0)
MCHC: 34.7 g/dL (ref 30.0–36.0)
MCV: 91.1 fL (ref 78.0–100.0)
MONOS PCT: 10 % (ref 3–12)
Monocytes Absolute: 1.2 10*3/uL — ABNORMAL HIGH (ref 0.1–1.0)
NEUTROS ABS: 9.9 10*3/uL — AB (ref 1.7–7.7)
Neutrophils Relative %: 78 % — ABNORMAL HIGH (ref 43–77)
Platelets: 150 10*3/uL (ref 150–400)
RBC: 4.59 MIL/uL (ref 4.22–5.81)
RDW: 12.6 % (ref 11.5–15.5)
WBC: 12.6 10*3/uL — AB (ref 4.0–10.5)

## 2013-09-21 LAB — I-STAT TROPONIN, ED: Troponin i, poc: 0 ng/mL (ref 0.00–0.08)

## 2013-09-21 NOTE — ED Provider Notes (Signed)
CSN: [id number]     Arrival date & time 09/21/13  1043 History   First MD Initiated Contact with Patient 09/21/13 1144     Chief Complaint  Patient presents with  . Chest Pain      Patient is a 23 y.o. male presenting with chest pain. The history is provided by the patient.  Chest Pain Pain location:  R chest Pain quality: pressure and sharp   Pain radiates to:  Does not radiate Pain radiates to the back: no   Pain severity:  Moderate Duration:  3 days Timing:  Intermittent Progression:  Improving Relieved by:  Rest Worsened by:  Deep breathing and exertion Associated symptoms: cough   Associated symptoms: no syncope   pt reports he has had CP for past 3 days Worse with walking up stairs but also with deep breathing No SOB at rest He reports cough without hemoptysis No fever is reported No syncope He denies medical conditions   No h/o DVT/PE   PMH - none Soc hx - denies cocaine use History reviewed. No pertinent past surgical history. History reviewed. No pertinent family history. History  Substance Use Topics  . Smoking status: Never Smoker   . Smokeless tobacco: Not on file  . Alcohol Use: Yes     Comment: occ    Review of Systems  Respiratory: Positive for cough.   Cardiovascular: Positive for chest pain. Negative for syncope.  All other systems reviewed and are negative.     Allergies  Review of patient's allergies indicates no known allergies.  Home Medications   Prior to Admission medications   Not on File   BP 111/75  Pulse 53  Temp(Src) 98.3 F (36.8 C) (Oral)  Resp 15  Wt 143 lb (64.864 kg)  SpO2 100% Physical Exam CONSTITUTIONAL: Well developed/well nourished HEAD: Normocephalic/atraumatic EYES: EOMI/PERRL ENMT: Mucous membranes moist NECK: supple no meningeal signs SPINE:entire spine nontender CV: S1/S2 noted, no murmurs/rubs/gallops noted LUNGS: Lungs are clear to auscultation bilaterally, no apparent distress ABDOMEN: soft,  nontender, no rebound or guarding GU:no cva tenderness NEURO: Pt is awake/alert, moves all extremitiesx4 EXTREMITIES: pulses normal, full ROM, no LE edema or calf tenderness SKIN: warm, color normal PSYCH: no abnormalities of mood noted  ED Course  Procedures   Low suspicion for ACS/PE/Dissection He is well appearing, comfortable watching TV Appears PERC negative Stable for d/c home Labs Review Labs Reviewed  CBC WITH DIFFERENTIAL - Abnormal; Notable for the following:    WBC 12.6 (*)    Neutrophils Relative % 78 (*)    Neutro Abs 9.9 (*)    Lymphocytes Relative 10 (*)    Monocytes Absolute 1.2 (*)    All other components within normal limits  BASIC METABOLIC PANEL - Abnormal; Notable for the following:    Glucose, Bld 100 (*)    All other components within normal limits  I-STAT TROPOININ, ED    Imaging Review Dg Chest 2 View  09/21/2013   CLINICAL DATA:  Chest pain  EXAM: CHEST  2 VIEW  COMPARISON:  None.  FINDINGS: Lungs are clear. Heart size and pulmonary vascularity are normal. No adenopathy. No pneumothorax. No bone lesions.  IMPRESSION: No abnormality noted.   Electronically Signed   By: Bretta BangWilliam  Woodruff M.D.   On: 09/21/2013 13:08     EKG Interpretation   Date/Time:  Tuesday September 21 2013 12:50:35 EDT Ventricular Rate:  67 PR Interval:  98 QRS Duration: 86 QT Interval:  390 QTC Calculation: 412 R  Axis:   90 Text Interpretation:  Sinus rhythm with sinus arrhythmia with short PR  Rightward axis Borderline ECG Confirmed by Bebe ShaggyWICKLINE  MD, Dorinda HillNALD ([id number]) on  09/21/2013 1:02:15 PM      MDM   Final diagnoses:  Chest pain    Nursing notes including past medical history and social history reviewed and considered in documentation xrays reviewed and considered Labs/vital reviewed and considered     Joya Gaskinsonald W Motty Borin, MD 09/21/13 1539

## 2013-09-21 NOTE — ED Notes (Signed)
Patient transported to X-ray 

## 2013-09-21 NOTE — ED Notes (Signed)
Per pt sts he has been having chest congestion, nasal congestion with pain over the past few days. sts green mucous from nose.

## 2013-09-21 NOTE — Discharge Instructions (Signed)

## 2014-05-30 ENCOUNTER — Encounter (HOSPITAL_BASED_OUTPATIENT_CLINIC_OR_DEPARTMENT_OTHER): Payer: Self-pay | Admitting: *Deleted

## 2014-05-30 ENCOUNTER — Emergency Department (HOSPITAL_BASED_OUTPATIENT_CLINIC_OR_DEPARTMENT_OTHER)
Admission: EM | Admit: 2014-05-30 | Discharge: 2014-05-31 | Disposition: A | Discharge status: Home / Self Care | Payer: Self-pay | Attending: Emergency Medicine | Admitting: Emergency Medicine

## 2014-05-30 DIAGNOSIS — Z0000 Encounter for general adult medical examination without abnormal findings: Secondary | ICD-10-CM | POA: Insufficient documentation

## 2014-05-30 DIAGNOSIS — Z139 Encounter for screening, unspecified: Secondary | ICD-10-CM

## 2014-05-30 LAB — URINE MICROSCOPIC-ADD ON

## 2014-05-30 LAB — URINALYSIS, ROUTINE W REFLEX MICROSCOPIC
Bilirubin Urine: NEGATIVE
GLUCOSE, UA: NEGATIVE mg/dL
Hgb urine dipstick: NEGATIVE
Ketones, ur: NEGATIVE mg/dL
Nitrite: NEGATIVE
PROTEIN: NEGATIVE mg/dL
Specific Gravity, Urine: 1.028 (ref 1.005–1.030)
Urobilinogen, UA: 1 mg/dL (ref 0.0–1.0)
pH: 6.5 (ref 5.0–8.0)

## 2014-05-30 NOTE — ED Notes (Addendum)
Wants to be checked for std, denies any sx ? Girlfriend on meds for herpes but she denies having it

## 2014-05-30 NOTE — ED Notes (Signed)
Pt requesting STD check girlfriend has herpes

## 2014-05-31 NOTE — ED Provider Notes (Signed)
CSN: [id number]     Arrival date & time 05/30/14  2240 History   First MD Initiated Contact with Patient 05/30/14 2357     Chief Complaint  Patient presents with  . Exposure to STD   Curtis [name] is a 24 y.o. male who presents the emergency department with his girlfriend requesting to be checked for STDs. He reports his girlfriend was screened for HSV and tested positive for one of them and he is concerned he might have herpes. He denies any exposure to genital lesions. He denies having any genital lesions. The girlfriend denies ever having any genital lesions or herpes. The girlfriend is not being treated for herpes. The patient denies any symptoms of an STD. He denies fevers, penile discharge, penile pain, scrotal pain, testicular pain, genital lesions, rashes, or dysuria. He reports he was last screened for STDs 02/2014 which was negative. He reports only having intercourse with his girlfriend since this time. He denies multiple sexual partners. He refuses GU exam as he is not having any symptoms.   (Consider location/radiation/quality/duration/timing/severity/associated sxs/prior Treatment) HPI  History reviewed. No pertinent past medical history. History reviewed. No pertinent past surgical history. History reviewed. No pertinent family history. History  Substance Use Topics  . Smoking status: Never Smoker   . Smokeless tobacco: Not on file  . Alcohol Use: Yes     Comment: occ    Review of Systems  Constitutional: Negative for fever.  HENT: Negative for congestion, mouth sores and sore throat.   Respiratory: Negative for cough, shortness of breath and wheezing.   Gastrointestinal: Negative for nausea, vomiting, abdominal pain and diarrhea.  Genitourinary: Negative for dysuria, urgency, frequency, hematuria, discharge, penile swelling, scrotal swelling, difficulty urinating, genital sores, penile pain and testicular pain.  Musculoskeletal: Negative for back pain and neck pain.   Skin: Negative for color change, rash and wound.  Neurological: Negative for headaches.      Allergies  Review of patient's allergies indicates no known allergies.  Home Medications   Prior to Admission medications   Not on File   BP 117/69 mmHg  Pulse 66  Temp(Src) 98.4 F (36.9 C)  Resp 16  Ht 5\' 7"  (1.702 m)  Wt 160 lb (72.576 kg)  BMI 25.05 kg/m2  SpO2 100% Physical Exam  Constitutional: He appears well-developed and well-nourished. No distress.  HENT:  Head: Normocephalic and atraumatic.  Eyes: Right eye exhibits no discharge. Left eye exhibits no discharge.  Cardiovascular: Normal rate, regular rhythm, normal heart sounds and intact distal pulses.   Pulmonary/Chest: Effort normal. No respiratory distress.  Abdominal: Soft. Bowel sounds are normal. There is no tenderness.  Genitourinary:  Patient refuses any GU exam as he states he is not having symptoms.   Neurological: He is alert. Coordination normal.  Skin: No rash noted. He is not diaphoretic.  Psychiatric: He has a normal mood and affect. His behavior is normal.  Nursing note and vitals reviewed.   ED Course  Procedures (including critical care time) Labs Review Labs Reviewed  URINALYSIS, ROUTINE W REFLEX MICROSCOPIC - Abnormal; Notable for the following:    Leukocytes, UA SMALL (*)    All other components within normal limits  URINE MICROSCOPIC-ADD ON - Abnormal; Notable for the following:    Bacteria, UA FEW (*)    All other components within normal limits    Imaging Review No results found.   EKG Interpretation None      Filed Vitals:   05/30/14 2244  BP:  117/69  Pulse: 66  Temp: 98.4 F (36.9 C)  Resp: 16  Height:  (1.702 m)  Weight: 160 lb (72.576 kg)  SpO2: 100%     MDM   Final diagnoses:  Encounter for medical screening examination   This is a 24 year old male who presents the emergency department requesting to be checked for STDs. Patient denies any symptoms of  STDs. He reports his girlfriend had a blood test positive for HSV. He is concerned have herpes. The girlfriend denies any symptoms of herpes. The girlfriend is not being treated for herpes. The patient denies any genital lesions or rashes. He denies any penile discharge or dysuria. He denies any abdominal pain. He denies multiple sexual partners. He was last checked for STDs 3 months ago and was negative. He reports only one sexual partner and this time which is his girlfriend. Patient is afebrile and nontoxic-appearing. His abdomen is soft and nontender to palpation. The patient refuses any GU exam as he states he's not having any symptoms. I referred this patient to the health department for STD screening as he is not currently having any symptoms. His urine is negative for infection.       Lawana Chambers, PA-C 05/31/14 1610  Derwood Kaplan, MD 05/31/14 [phone]

## 2014-05-31 NOTE — Discharge Instructions (Signed)
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
## 2015-12-07 ENCOUNTER — Emergency Department (HOSPITAL_BASED_OUTPATIENT_CLINIC_OR_DEPARTMENT_OTHER)
Admission: EM | Admit: 2015-12-07 | Discharge: 2015-12-07 | Disposition: A | Discharge status: Home / Self Care | Payer: Self-pay | Attending: Emergency Medicine | Admitting: Emergency Medicine

## 2015-12-07 ENCOUNTER — Encounter (HOSPITAL_BASED_OUTPATIENT_CLINIC_OR_DEPARTMENT_OTHER): Payer: Self-pay | Admitting: Emergency Medicine

## 2015-12-07 DIAGNOSIS — X58XXXA Exposure to other specified factors, initial encounter: Secondary | ICD-10-CM | POA: Insufficient documentation

## 2015-12-07 DIAGNOSIS — R634 Abnormal weight loss: Secondary | ICD-10-CM

## 2015-12-07 DIAGNOSIS — S39012A Strain of muscle, fascia and tendon of lower back, initial encounter: Secondary | ICD-10-CM

## 2015-12-07 DIAGNOSIS — Y999 Unspecified external cause status: Secondary | ICD-10-CM | POA: Insufficient documentation

## 2015-12-07 DIAGNOSIS — Y929 Unspecified place or not applicable: Secondary | ICD-10-CM | POA: Insufficient documentation

## 2015-12-07 DIAGNOSIS — R61 Generalized hyperhidrosis: Secondary | ICD-10-CM

## 2015-12-07 DIAGNOSIS — Y939 Activity, unspecified: Secondary | ICD-10-CM | POA: Insufficient documentation

## 2015-12-07 LAB — COMPREHENSIVE METABOLIC PANEL WITH GFR
ALT: 11 U/L — ABNORMAL LOW (ref 17–63)
AST: 14 U/L — ABNORMAL LOW (ref 15–41)
Albumin: 4.6 g/dL (ref 3.5–5.0)
Alkaline Phosphatase: 48 U/L (ref 38–126)
Anion gap: 6 (ref 5–15)
BUN: 11 mg/dL (ref 6–20)
CO2: 26 mmol/L (ref 22–32)
Calcium: 9.6 mg/dL (ref 8.9–10.3)
Chloride: 106 mmol/L (ref 101–111)
Creatinine, Ser: 1.07 mg/dL (ref 0.61–1.24)
GFR calc Af Amer: >60 mL/min (ref >60)
GFR calc non Af Amer: >60 mL/min (ref >60)
Glucose, Bld: 98 mg/dL (ref 65–99)
Potassium: 4.2 mmol/L (ref 3.5–5.1)
Sodium: 138 mmol/L (ref 135–145)
Total Bilirubin: 0.7 mg/dL (ref 0.3–1.2)
Total Protein: 7.1 g/dL (ref 6.5–8.1)

## 2015-12-07 LAB — CBC WITH DIFFERENTIAL/PLATELET
BASOS ABS: 0 10*3/uL (ref 0.0–0.1)
Basophils Relative: 0 %
Eosinophils Absolute: 0.1 10*3/uL (ref 0.0–0.7)
Eosinophils Relative: 1 %
HCT: 42.4 % (ref 39.0–52.0)
HEMOGLOBIN: 15.1 g/dL (ref 13.0–17.0)
LYMPHS ABS: 1.9 10*3/uL (ref 0.7–4.0)
LYMPHS PCT: 26 %
MCH: 32.5 pg (ref 26.0–34.0)
MCHC: 35.6 g/dL (ref 30.0–36.0)
MCV: 91.4 fL (ref 78.0–100.0)
Monocytes Absolute: 0.8 10*3/uL (ref 0.1–1.0)
Monocytes Relative: 10 %
NEUTROS ABS: 4.6 10*3/uL (ref 1.7–7.7)
NEUTROS PCT: 63 %
PLATELETS: 136 10*3/uL — AB (ref 150–400)
RBC: 4.64 MIL/uL (ref 4.22–5.81)
RDW: 12.3 % (ref 11.5–15.5)
WBC: 7.4 10*3/uL (ref 4.0–10.5)

## 2015-12-07 LAB — RAPID HIV SCREEN (HIV 1/2 AB+AG)
HIV 1/2 Antibodies: NONREACTIVE
HIV-1 P24 Antigen - HIV24: NONREACTIVE

## 2015-12-07 LAB — URINALYSIS, ROUTINE W REFLEX MICROSCOPIC
Bilirubin Urine: NEGATIVE
Glucose, UA: NEGATIVE mg/dL
HGB URINE DIPSTICK: NEGATIVE
KETONES UR: NEGATIVE mg/dL
Leukocytes, UA: NEGATIVE
NITRITE: NEGATIVE
PH: 6.5 (ref 5.0–8.0)
Protein, ur: NEGATIVE mg/dL
SPECIFIC GRAVITY, URINE: 1.013 (ref 1.005–1.030)

## 2015-12-07 MED ORDER — IBUPROFEN 200 MG PO TABS
600.0000 mg | ORAL_TABLET | Freq: Once | ORAL | Status: AC
  Administered 2015-12-07: 600 mg via ORAL
  Filled 2015-12-07: qty 1

## 2015-12-07 MED ORDER — CYCLOBENZAPRINE HCL 5 MG PO TABS
5.0000 mg | ORAL_TABLET | Freq: Once | ORAL | Status: AC
  Administered 2015-12-07: 5 mg via ORAL
  Filled 2015-12-07: qty 1

## 2015-12-07 MED ORDER — CYCLOBENZAPRINE HCL 5 MG PO TABS
5.0000 mg | ORAL_TABLET | Freq: Three times a day (TID) | ORAL | 0 refills | Status: DC | PRN
Start: 2015-12-07 — End: 2015-12-08

## 2015-12-07 NOTE — ED Notes (Signed)
MD at bedside. 

## 2015-12-07 NOTE — Discharge Instructions (Signed)
Take motrin 600 mg every 6 hrs for pain .  Take flexeril for back spasms.   Stay hydrated.   You need to see Dr. Pearletha ForgeHudnall regarding your back.   [facility] for follow up regarding your night sweat and weight loss.   Return to ER if you have worse back pain, weakness, numbness, fevers, vomiting.

## 2015-12-07 NOTE — ED Provider Notes (Signed)
[facility]   CSN: [id number] Arrival date & time: 12/07/15  0919     History   Chief Complaint Chief Complaint  Patient presents with  . Night Sweats    HPI Curtis [name] is a 25 y.o. male otherwise healthy here with night sweats, weight loss, back pain. Patient states that he has been having night sweats that intermittently going on for the last 3 years. States that he does wear much clothes when he sleeps and does not put on a heavy blanket but still smokes occasionally. Denies any fevers or chills or cough. Patient also states that he's been losing weight gradually. He was last seen here in 2016 and was 160 ibs and is 146 ibs today. Denies abdominal pain or vomiting. Also has intermittent back spasms, denies injuries. Had previous visit for back pain but didn't see a specialist. He states that his father has night sweats and he has no PCP currently.   The history is provided by the patient.    History reviewed. No pertinent past medical history.  There are no active problems to display for this patient.   History reviewed. No pertinent surgical history.     Home Medications    Prior to Admission medications   Not on File    Family History History reviewed. No pertinent family history.  Social History Social History  Substance Use Topics  . Smoking status: Never Smoker  . Smokeless tobacco: Never Used  . Alcohol use Not on file     Comment: occ     Allergies   Review of patient's allergies indicates no known allergies.   Review of Systems Review of Systems  Constitutional: Positive for unexpected weight change.       Night sweats   Musculoskeletal: Positive for back pain.  All other systems reviewed and are negative.    Physical Exam Updated Vital Signs BP 124/87 (BP Location: Right Arm)   Pulse (!) 55   Temp 98.4 F (36.9 C) (Oral)   Resp 16   Ht 5\' 7"  (1.702 m)   Wt 146 lb (66.2 kg)   SpO2 100%   BMI 22.87 kg/m    Physical Exam  Constitutional: He is oriented to person, place, and time.  Thin, well appearing, well hydrated   HENT:  Head: Normocephalic.  Eyes: EOM are normal. Pupils are equal, round, and reactive to light.  Neck: Normal range of motion. Neck supple.  Cardiovascular: Normal rate, regular rhythm and normal heart sounds.   Pulmonary/Chest: Effort normal and breath sounds normal. No respiratory distress. He has no wheezes. He has no rales.  Abdominal: Soft. Bowel sounds are normal. He exhibits no distension. There is no tenderness. There is no guarding.  Musculoskeletal: Normal range of motion.  No midline tenderness, ? Mild paralumbar tenderness.   Neurological: He is alert and oriented to person, place, and time.  No saddle anesthesia, nl strength bilateral lower extremities. Nl gait   Skin: Skin is warm.  Psychiatric: He has a normal mood and affect.  Nursing note and vitals reviewed.    ED Treatments / Results  Labs (all labs ordered are listed, but only abnormal results are displayed) Labs Reviewed  CBC WITH DIFFERENTIAL/PLATELET - Abnormal; Notable for the following:       Result Value   Platelets 136 (*)    All other components within normal limits  COMPREHENSIVE METABOLIC PANEL - Abnormal; Notable for the following:    AST 14 (*)  ALT 11 (*)    All other components within normal limits  URINALYSIS, ROUTINE W REFLEX MICROSCOPIC (NOT AT [facility])  RAPID HIV SCREEN (HIV 1/2 AB+AG)    EKG  EKG Interpretation None       Radiology No results found.  Procedures Procedures (including critical care time)  Medications Ordered in ED Medications  ibuprofen (ADVIL,MOTRIN) tablet 600 mg (600 mg Oral Given 12/07/15 1031)  cyclobenzaprine (FLEXERIL) tablet 5 mg (5 mg Oral Given 12/07/15 1031)     Initial Impression / Assessment and Plan / ED Course  I have reviewed the triage vital signs and the nursing notes.  Pertinent labs & imaging results that were available  during my care of the patient were reviewed by me and considered in my medical decision making (see chart for details).  Clinical Course   Curtis Organaran D [name] is a 25 y.o. male here with night sweats, back pain, weight loss. Vitals stable, afebrile. Symptoms for several years and doesn't have PCP. Lungs clear, abdomen nontender. No recent incarceration or TB risk factors. Will get CBC, CMP, rapid HIV, UA. Low suspicion for lymphoma or leukemia as he has no fever. Back pain is likely muscle strain and he is neurovascular intact and will not need imaging. Can give motrin and flexeril. If labs and UA unremarkable, he should follow up with a primary care doctor and get further workup as needed.   11:06 AM CBC nl. HIV neg. UA unremarkable. Will refer to ortho for back strain. Will have [facility] for follow up.    Final Clinical Impressions(s) / ED Diagnoses   Final diagnoses:  None    New Prescriptions New Prescriptions   No medications on file     Charlynne Panderavid Hsienta Yao, MD 12/07/15 [phone]

## 2015-12-08 ENCOUNTER — Ambulatory Visit (INDEPENDENT_AMBULATORY_CARE_PROVIDER_SITE_OTHER): Payer: Self-pay | Admitting: Family Medicine

## 2015-12-08 ENCOUNTER — Ambulatory Visit (HOSPITAL_BASED_OUTPATIENT_CLINIC_OR_DEPARTMENT_OTHER)
Admission: RE | Admit: 2015-12-08 | Discharge: 2015-12-08 | Disposition: A | Discharge status: Home / Self Care | Payer: Self-pay | Source: Ambulatory Visit | Attending: Family Medicine | Admitting: Family Medicine

## 2015-12-08 ENCOUNTER — Encounter: Payer: Self-pay | Admitting: Family Medicine

## 2015-12-08 VITALS — BP 109/72 | HR 62 | Ht 67.0 in | Wt 139.0 lb

## 2015-12-08 DIAGNOSIS — M79604 Pain in right leg: Secondary | ICD-10-CM

## 2015-12-08 DIAGNOSIS — M545 Low back pain: Secondary | ICD-10-CM

## 2015-12-08 DIAGNOSIS — M79605 Pain in left leg: Secondary | ICD-10-CM

## 2015-12-08 MED ORDER — MELOXICAM 15 MG PO TABS: 15.0000 mg | ORAL_TABLET | Freq: Every day | ORAL | 2 refills | Status: DC

## 2015-12-08 MED ORDER — CYCLOBENZAPRINE HCL 5 MG PO TABS: 5.0000 mg | ORAL_TABLET | Freq: Three times a day (TID) | ORAL | 0 refills | Status: DC | PRN

## 2015-12-08 MED FILL — CYCLOBENZAPRINE 5 MG TABLET: 5 | 20 days supply | Qty: 60 | Fill #0

## 2015-12-08 MED FILL — MELOXICAM 15 MG TABLET: 15 | 30 days supply | Qty: 30 | Fill #0

## 2015-12-08 NOTE — Patient Instructions (Signed)
You have lumbar radiculopathy (a pinched nerve in your low back). A prednisone dose pack is the best option for immediate relief and may be prescribed. Start meloxicam 15mg  daily with food for pain and inflammation. Flexeril as needed for muscle spasms (no driving on this medicine if it makes you sleepy). Stay as active as possible. Physical therapy has been shown to be helpful as well. Strengthening of low back muscles, abdominal musculature are key for long term pain relief. If not improving, will consider further imaging (MRI). Call us when the cone coverage goes through to start physical therapy.

## 2015-12-13 DIAGNOSIS — M545 Low back pain, unspecified: Secondary | ICD-10-CM | POA: Insufficient documentation
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
## 2015-12-13 NOTE — Assessment & Plan Note (Signed)
describes radiculopathy though exam is reassuring.  Independently reviewed radiographs and no spondy seen or other abnormalities.  He will start with meloxicam, take flexeril as needed.  Paperwork given for cone coverage.  He will consider prednisone, MRI, physical therapy depending on how he's doing.

## 2015-12-23 ENCOUNTER — Encounter (HOSPITAL_BASED_OUTPATIENT_CLINIC_OR_DEPARTMENT_OTHER): Payer: Self-pay | Admitting: *Deleted

## 2015-12-23 DIAGNOSIS — K029 Dental caries, unspecified: Secondary | ICD-10-CM | POA: Insufficient documentation

## 2015-12-23 DIAGNOSIS — Z79899 Other long term (current) drug therapy: Secondary | ICD-10-CM | POA: Insufficient documentation

## 2015-12-23 NOTE — ED Triage Notes (Signed)
Dental pain-requesting antibiotics prior to have dental procedure done for cavity.

## 2015-12-24 ENCOUNTER — Emergency Department (HOSPITAL_BASED_OUTPATIENT_CLINIC_OR_DEPARTMENT_OTHER)
Admission: EM | Admit: 2015-12-24 | Discharge: 2015-12-24 | Disposition: A | Discharge status: Home / Self Care | Payer: Self-pay | Attending: Emergency Medicine | Admitting: Emergency Medicine

## 2015-12-24 DIAGNOSIS — K029 Dental caries, unspecified: Secondary | ICD-10-CM

## 2015-12-24 MED ORDER — NAPROXEN 500 MG PO TABS: 500.0000 mg | ORAL_TABLET | Freq: Two times a day (BID) | ORAL | 0 refills | Status: DC

## 2015-12-24 MED ORDER — PENICILLIN V POTASSIUM 500 MG PO TABS: 500.0000 mg | ORAL_TABLET | Freq: Four times a day (QID) | ORAL | 0 refills | Status: AC

## 2015-12-24 NOTE — ED Notes (Signed)
Pt given d/c instructions as per chart. Rx x 2. Verbalizes understanding. No questions. 

## 2015-12-24 NOTE — Discharge Instructions (Signed)
Take antibiotics as prescribed. See your dentist as scheduled.

## 2015-12-24 NOTE — ED Provider Notes (Signed)
[facility]   CSN: [id number] Arrival date & time: 12/23/15  2307     History   Chief Complaint Chief Complaint  Patient presents with  . Dental Pain    HPI  Curtis [name] is an 25 y.o. male with no significant PMH who presents to the ED for prescription for antibiotics prior to a dental procedure. He states he is seeing a dentist who is planning on repairing a cavity and extracting a tooth next week. However, he states when he made the appointment the dentist told him to come to the ER for a prescription for antibiotics. States the pain is coming from his wisdom teeth. Taking Goody's intermittently with mild relief of pain. No fever, nausea, vomiting, dysphagia.  History reviewed. No pertinent past medical history.  Patient Active Problem List   Diagnosis Date Noted  . Low back pain radiating to both legs 12/13/2015    History reviewed. No pertinent surgical history.     Home Medications    Prior to Admission medications   Medication Sig Start Date End Date Taking? Authorizing Provider  cyclobenzaprine (FLEXERIL) 10 MG tablet Take 10 mg by mouth 3 (three) times daily as needed for muscle spasms.   Yes Historical Provider, MD  meloxicam (MOBIC) 15 MG tablet Take 1 tablet (15 mg total) by mouth daily. 12/08/15  Yes Lenda KelpShane R Hudnall, MD  naproxen (NAPROSYN) 500 MG tablet Take 1 tablet (500 mg total) by mouth 2 (two) times daily. 12/24/15   Ace GinsSerena Y Geriann Lafont, PA-C  penicillin v potassium (VEETID) 500 MG tablet Take 1 tablet (500 mg total) by mouth 4 (four) times daily. 12/24/15 12/31/15  Carlene CoriaSerena Y Kamylle Axelson, PA-C    Family History History reviewed. No pertinent family history.  Social History Social History  Substance Use Topics  . Smoking status: Never Smoker  . Smokeless tobacco: Never Used  . Alcohol use No     Comment: occ     Allergies   Review of patient's allergies indicates no known allergies.   Review of Systems Review of Systems 10 Systems  reviewed and are negative for acute change except as noted in the HPI.  Physical Exam Updated Vital Signs BP 117/72 (BP Location: Left Arm)   Pulse 78   Temp 98 F (36.7 C) (Oral)   Resp 18   Ht 5\' 7"  (1.702 m)   Wt 63.5 kg   SpO2 100%   BMI 21.93 kg/m   Physical Exam  Constitutional: He is oriented to person, place, and time. No distress.  HENT:  Head: Atraumatic.  Right Ear: External ear normal.  Left Ear: External ear normal.  Nose: Nose normal.  Partially erupted lower 3rd molars with no gross abscess and mild surrounding gingival inflammation.  Eyes: Conjunctivae are normal. No scleral icterus.  Cardiovascular: Normal rate and regular rhythm.   Pulmonary/Chest: Effort normal. No respiratory distress.  Abdominal: He exhibits no distension.  Neurological: He is alert and oriented to person, place, and time.  Skin: Skin is warm and dry. He is not diaphoretic.  Psychiatric: He has a normal mood and affect. His behavior is normal.  Nursing note and vitals reviewed.    ED Treatments / Results  Labs (all labs ordered are listed, but only abnormal results are displayed) Labs Reviewed - No data to display  EKG  EKG Interpretation None       Radiology No results found.  Procedures Procedures (including critical care time)  Medications Ordered in ED  Medications - No data to display   Initial Impression / Assessment and Plan / ED Course  I have reviewed the triage vital signs and the nursing notes.  Pertinent labs & imaging results that were available during my care of the patient were reviewed by me and considered in my medical decision making (see chart for details).  Clinical Course   Pt has dentist appt next week. Rx given for Pen VK. Naproxen as needed for pain. Pt afebrile hemodynamically stable and nontoxic appearing. ER return precautions given.  Final Clinical Impressions(s) / ED Diagnoses   Final diagnoses:  Dental caries    New  Prescriptions New Prescriptions   NAPROXEN (NAPROSYN) 500 MG TABLET    Take 1 tablet (500 mg total) by mouth 2 (two) times daily.   PENICILLIN V POTASSIUM (VEETID) 500 MG TABLET    Take 1 tablet (500 mg total) by mouth 4 (four) times daily.     Carlene Coria, PA-C 12/24/15 0037    Paula Libra, MD 12/24/15 [phone]

## 2015-12-24 NOTE — ED Notes (Signed)
Pt states his right lower molar has been hurting off and on for awhile. Today bit into sandwich and it started hurting worse.

## 2016-06-17 ENCOUNTER — Encounter (HOSPITAL_BASED_OUTPATIENT_CLINIC_OR_DEPARTMENT_OTHER): Payer: Self-pay | Admitting: *Deleted

## 2016-06-17 ENCOUNTER — Emergency Department (HOSPITAL_BASED_OUTPATIENT_CLINIC_OR_DEPARTMENT_OTHER): Payer: PRIVATE HEALTH INSURANCE

## 2016-06-17 ENCOUNTER — Emergency Department (HOSPITAL_BASED_OUTPATIENT_CLINIC_OR_DEPARTMENT_OTHER)
Admission: EM | Admit: 2016-06-17 | Discharge: 2016-06-17 | Disposition: A | Discharge status: Home / Self Care | Payer: PRIVATE HEALTH INSURANCE | Attending: Emergency Medicine | Admitting: Emergency Medicine

## 2016-06-17 DIAGNOSIS — R05 Cough: Secondary | ICD-10-CM | POA: Diagnosis present

## 2016-06-17 DIAGNOSIS — J069 Acute upper respiratory infection, unspecified: Secondary | ICD-10-CM | POA: Insufficient documentation

## 2016-06-17 DIAGNOSIS — Z79899 Other long term (current) drug therapy: Secondary | ICD-10-CM | POA: Insufficient documentation

## 2016-06-17 MED ORDER — GUAIFENESIN-DM 100-10 MG/5ML PO SYRP: 5.0000 mL | ORAL_SOLUTION | Freq: Three times a day (TID) | ORAL | 0 refills | Status: DC | PRN

## 2016-06-17 MED FILL — ROBAFEN-DM SYRUP: 100-10 | 8 days supply | Qty: 118 | Fill #0

## 2016-06-17 NOTE — ED Triage Notes (Signed)
Cough, sneezing, chills and gasping for air x 3 days. He is in no resp distress at triage. Ambulatory and speaking in complete sentences.

## 2016-06-17 NOTE — ED Provider Notes (Signed)
[facility]   CSN: [id number] Arrival date & time: 06/17/16  1231     History   Chief Complaint Chief Complaint  Patient presents with  . Cough    HPI Curtis Organaran D [name] is a 26 y.o. male.  HPI Patient presents with cough and sore throat. Has had it for the last 3 days. Throat is worse with swallowing began first. States he does ache all over. Has had some slight diarrhea. States he coughed up a little blood. Occasional green sputum production, mostly just a dry cough. No definite sick contacts. No abdominal pain. States he does have dull chest pain with the coughing.   History reviewed. No pertinent past medical history.  Patient Active Problem List   Diagnosis Date Noted  . Low back pain radiating to both legs 12/13/2015    History reviewed. No pertinent surgical history.     Home Medications    Prior to Admission medications   Medication Sig Start Date End Date Taking? Authorizing Provider  cyclobenzaprine (FLEXERIL) 10 MG tablet Take 10 mg by mouth 3 (three) times daily as needed for muscle spasms.   Yes Historical Provider, MD  guaiFENesin-dextromethorphan (ROBITUSSIN DM) 100-10 MG/5ML syrup Take 5 mLs by mouth 3 (three) times daily as needed for cough. 06/17/16   Benjiman CoreNathan Kingstyn Deruiter, MD  meloxicam (MOBIC) 15 MG tablet Take 1 tablet (15 mg total) by mouth daily. 12/08/15   Lenda KelpShane R Hudnall, MD  naproxen (NAPROSYN) 500 MG tablet Take 1 tablet (500 mg total) by mouth 2 (two) times daily. 12/24/15   Carlene CoriaSerena Y Sam, PA-C    Family History No family history on file.  Social History Social History  Substance Use Topics  . Smoking status: Never Smoker  . Smokeless tobacco: Never Used  . Alcohol use No     Comment: occ     Allergies   Patient has no known allergies.   Review of Systems Review of Systems  Constitutional: Negative for appetite change.  HENT: Positive for sore throat. Negative for tinnitus.   Eyes: Negative for visual  disturbance.  Respiratory: Positive for cough and shortness of breath.   Cardiovascular: Positive for chest pain.  Gastrointestinal: Negative for abdominal pain.  Endocrine: Negative for polyuria.  Genitourinary: Negative for flank pain.  Musculoskeletal: Positive for myalgias.  Neurological: Negative for weakness.  Hematological: Negative for adenopathy. Does not bruise/bleed easily.  Psychiatric/Behavioral: Negative for confusion.     Physical Exam Updated Vital Signs BP 106/75 (BP Location: Left Arm)   Pulse 69   Temp 98.4 F (36.9 C) (Oral)   Resp 18   Ht 5\' 7"  (1.702 m)   Wt 156 lb (70.8 kg)   SpO2 100%   BMI 24.43 kg/m   Physical Exam  Constitutional: He appears well-developed.  HENT:  Head: Atraumatic.  Posterior pharyngeal erythema without exudate. Uvula midline.  Neck: Neck supple.  Cardiovascular: Normal rate.   Pulmonary/Chest: Effort normal. He has no wheezes. He has no rales.  Abdominal: Soft. There is no tenderness.  Musculoskeletal: He exhibits no edema.  Lymphadenopathy:    He has no cervical adenopathy.  Neurological: He is alert.  Skin: Skin is warm. Capillary refill takes less than 2 seconds.  Psychiatric: He has a normal mood and affect.     ED Treatments / Results  Labs (all labs ordered are listed, but only abnormal results are displayed) Labs Reviewed - No data to display  EKG  EKG Interpretation None  Radiology Dg Chest 2 View  Result Date: 06/17/2016 CLINICAL DATA:  Cough and chest congestion and shortness of breath. EXAM: CHEST  2 VIEW COMPARISON:  09/21/2013 FINDINGS: The heart size and mediastinal contours are within normal limits. Both lungs are clear. The visualized skeletal structures are unremarkable. IMPRESSION: Normal chest. Electronically Signed   By: Francene Boyers M.D.   On: 06/17/2016 13:04    Procedures Procedures (including critical care time)  Medications Ordered in ED Medications - No data to  display   Initial Impression / Assessment and Plan / ED Course  I have reviewed the triage vital signs and the nursing notes.  Pertinent labs & imaging results that were available during my care of the patient were reviewed by me and considered in my medical decision making (see chart for details).     Patient with URI symptoms and sore throat. Clinically does not appear to be strep. X-ray done due to cough and hemoptysis. No other signs that would worry for thrombocytopenia. Discharge home with antitussive.  Final Clinical Impressions(s) / ED Diagnoses   Final diagnoses:  Upper respiratory tract infection, unspecified type    New Prescriptions New Prescriptions   GUAIFENESIN-DEXTROMETHORPHAN (ROBITUSSIN DM) 100-10 MG/5ML SYRUP    Take 5 mLs by mouth 3 (three) times daily as needed for cough.     Benjiman Core, MD 06/17/16 [id number]

## 2016-06-21 ENCOUNTER — Encounter (HOSPITAL_BASED_OUTPATIENT_CLINIC_OR_DEPARTMENT_OTHER): Payer: Self-pay | Admitting: Emergency Medicine

## 2016-06-21 ENCOUNTER — Emergency Department (HOSPITAL_BASED_OUTPATIENT_CLINIC_OR_DEPARTMENT_OTHER)
Admission: EM | Admit: 2016-06-21 | Discharge: 2016-06-21 | Disposition: A | Discharge status: Home / Self Care | Payer: PRIVATE HEALTH INSURANCE | Attending: Emergency Medicine | Admitting: Emergency Medicine

## 2016-06-21 DIAGNOSIS — J209 Acute bronchitis, unspecified: Secondary | ICD-10-CM

## 2016-06-21 DIAGNOSIS — R05 Cough: Secondary | ICD-10-CM | POA: Diagnosis present

## 2016-06-21 DIAGNOSIS — Z79899 Other long term (current) drug therapy: Secondary | ICD-10-CM | POA: Diagnosis not present

## 2016-06-21 MED ORDER — AZITHROMYCIN 250 MG PO TABS: 250.0000 mg | ORAL_TABLET | Freq: Every day | ORAL | 0 refills | Status: DC

## 2016-06-21 MED ORDER — BENZONATATE 100 MG PO CAPS
100.0000 mg | ORAL_CAPSULE | Freq: Three times a day (TID) | ORAL | 0 refills | Status: DC | PRN
Start: 2016-06-21 — End: 2016-07-20

## 2016-06-21 MED FILL — AZITHROMYCIN 250 MG TABLET: 250 | 5 days supply | Qty: 6 | Fill #0

## 2016-06-21 MED FILL — BENZONATATE 100 MG CAP: 100 | 7 days supply | Qty: 21 | Fill #0

## 2016-06-21 NOTE — ED Notes (Signed)
Pt directed to pharmacy to pick up Rx. Work note given 

## 2016-06-21 NOTE — Discharge Instructions (Signed)
Please read instructions below. Talk with your primary care provider about any new medications. Return if you have chest pain, difficulty breathing, uncontrollable fever.

## 2016-06-21 NOTE — ED Provider Notes (Signed)
[facility]   CSN: [id number] Arrival date & time: 06/21/16  1033     History   Chief Complaint Chief Complaint  Patient presents with  . Cough    HPI Curtis [name] is a 26 y.o. male.  Pt presents w recurrent complaint of cough x1wk. Pt was seen on 3/12 and was given dx of bronchitis and sent home w Robitussin DM. Pt reports finishing medication and has had no relief. Reports cough is persistent w some episodes of dark red sputum. Reports dry throat and chest tightness. Denies CP, SOB, pain w breathing, congestion, F/C, abd pain, N/V. Denies hx asthma or DVT. Pt smokes 1-2 cigarettes/day. NKDA.      History reviewed. No pertinent past medical history.  Patient Active Problem List   Diagnosis Date Noted  . Low back pain radiating to both legs 12/13/2015    History reviewed. No pertinent surgical history.     Home Medications    Prior to Admission medications   Medication Sig Start Date End Date Taking? Authorizing Provider  azithromycin (ZITHROMAX) 250 MG tablet Take 1 tablet (250 mg total) by mouth daily. Take first 2 tablets together, then 1 every day until finished. 06/21/16   [place] Nicole Russo, PA-C  benzonatate (TESSALON) 100 MG capsule Take 1 capsule (100 mg total) by mouth 3 (three) times daily as needed for cough. 06/21/16   [place] Nicole Russo, PA-C  cyclobenzaprine (FLEXERIL) 10 MG tablet Take 10 mg by mouth 3 (three) times daily as needed for muscle spasms.    Historical Provider, MD  meloxicam (MOBIC) 15 MG tablet Take 1 tablet (15 mg total) by mouth daily. 12/08/15   Lenda Kelp, MD  naproxen (NAPROSYN) 500 MG tablet Take 1 tablet (500 mg total) by mouth 2 (two) times daily. 12/24/15   Carlene Coria, PA-C    Family History No family history on file.  Social History Social History  Substance Use Topics  . Smoking status: Never Smoker  . Smokeless tobacco: Never Used  . Alcohol use No     Comment: occ     Allergies     Patient has no known allergies.   Review of Systems Review of Systems  Constitutional: Negative for appetite change, chills and fever.  HENT: Negative for congestion, ear pain, sore throat and trouble swallowing.        Reports dry throat.   Respiratory: Positive for cough and chest tightness. Negative for shortness of breath and wheezing.   Cardiovascular: Negative for chest pain.  Gastrointestinal: Negative for abdominal pain, nausea and vomiting.  Musculoskeletal: Negative for myalgias.  All other systems reviewed and are negative.    Physical Exam Updated Vital Signs BP 104/63 (BP Location: Left Arm)   Pulse 69   Temp 98.8 F (37.1 C) (Oral)   Resp 18   Ht 5\' 7"  (1.702 m)   Wt 62.1 kg   SpO2 100%   BMI 21.46 kg/m   Physical Exam  Constitutional: He is oriented to person, place, and time. He appears well-developed and well-nourished.  HENT:  Head: Normocephalic and atraumatic.  Eyes: Conjunctivae are normal.  Neck: Normal range of motion. Neck supple.  Cardiovascular: Normal rate, regular rhythm, normal heart sounds and intact distal pulses.   Pulmonary/Chest: Effort normal and breath sounds normal. No accessory muscle usage. No tachypnea. No respiratory distress. He has no wheezes. He has no rhonchi. He has no rales. He exhibits no tenderness.  Abdominal: Soft. Bowel  sounds are normal. He exhibits no distension and no mass. There is no tenderness.  Neurological: He is alert and oriented to person, place, and time.  Skin: Skin is warm and dry.  Psychiatric: He has a normal mood and affect. His behavior is normal.     ED Treatments / Results  Labs (all labs ordered are listed, but only abnormal results are displayed) Labs Reviewed - No data to display  EKG  EKG Interpretation None       Radiology No results found.  Procedures Procedures (including critical care time)  Medications Ordered in ED Medications - No data to display   Initial Impression  / Assessment and Plan / ED Course  I have reviewed the triage vital signs and the nursing notes.  Pertinent labs & imaging results that were available during my care of the patient were reviewed by me and considered in my medical decision making (see chart for details).     Pt presents w productive cough x1 week, some episodes of dark red sputum. Pt is not having difficulty breathing, no hx DVT. Exam reassuring, no respiratory distress, afebrile, not tachypneic, lungs are clear. CXR from 06/17/16 visit was normal.  PE is doubtful at this time. Cough is not improving x1wk, possible bacterial etiology. Discharge w zithromax and tessalon.  Patient discussed with and seen by Dr. Judd LieneLo.  Discussed results, findings, treatment and follow up. Patient advised of return precautions. Patient verbalized understanding and agreed with plan.   Final Clinical Impressions(s) / ED Diagnoses   Final diagnoses:  Acute bronchitis, unspecified organism    New Prescriptions Discharge Medication List as of 06/21/2016 12:10 PM    START taking these medications   Details  azithromycin (ZITHROMAX) 250 MG tablet Take 1 tablet (250 mg total) by mouth daily. Take first 2 tablets together, then 1 every day until finished., Starting Fri 06/21/2016, Print    benzonatate (TESSALON) 100 MG capsule Take 1 capsule (100 mg total) by mouth 3 (three) times daily as needed for cough., Starting Fri 06/21/2016, Print         [place] Nicole Russo, PA-C 06/21/16 1243    [place] Nicole Ridge[place] CornersRusso, PA-C 06/21/16 1244    Geoffery Lyonsouglas Delo, MD 06/21/16 1504

## 2016-06-21 NOTE — ED Triage Notes (Signed)
Pt reports cough x 1 week and occasional coughing up blood. Pt seen here 3/12 for same and given cough medicine, neg chest xray. Pt reports he has taken all of the cough medicine and symptoms still persist.

## 2016-07-20 ENCOUNTER — Emergency Department (HOSPITAL_BASED_OUTPATIENT_CLINIC_OR_DEPARTMENT_OTHER)
Admission: EM | Admit: 2016-07-20 | Discharge: 2016-07-20 | Disposition: A | Discharge status: Home / Self Care | Payer: PRIVATE HEALTH INSURANCE | Attending: Emergency Medicine | Admitting: Emergency Medicine

## 2016-07-20 ENCOUNTER — Encounter (HOSPITAL_BASED_OUTPATIENT_CLINIC_OR_DEPARTMENT_OTHER): Payer: Self-pay | Admitting: *Deleted

## 2016-07-20 DIAGNOSIS — R39198 Other difficulties with micturition: Secondary | ICD-10-CM | POA: Diagnosis not present

## 2016-07-20 DIAGNOSIS — R339 Retention of urine, unspecified: Secondary | ICD-10-CM | POA: Diagnosis present

## 2016-07-20 LAB — URINALYSIS, ROUTINE W REFLEX MICROSCOPIC
BILIRUBIN URINE: NEGATIVE
Glucose, UA: NEGATIVE mg/dL
Hgb urine dipstick: NEGATIVE
Ketones, ur: NEGATIVE mg/dL
Leukocytes, UA: NEGATIVE
NITRITE: NEGATIVE
Protein, ur: NEGATIVE mg/dL
Specific Gravity, Urine: 1.018 (ref 1.005–1.030)
pH: 7 (ref 5.0–8.0)

## 2016-07-20 NOTE — ED Provider Notes (Addendum)
[facility]   CSN: [id number] Arrival date & time: 07/20/16  1120     History   Chief Complaint Chief Complaint  Patient presents with  . Urinary Retention    HPI Curtis [name] is a 26 y.o. male.  Patient with several week history of urinating difficulty feels is not able to completely empty his bladder. No real pain with urination no blood. No history in his past like this. Denies any fevers new or worse back pain has some mild muscular skeletal back pain. No nausea or vomiting. No abdominal pain.      History reviewed. No pertinent past medical history.  Patient Active Problem List   Diagnosis Date Noted  . Low back pain radiating to both legs 12/13/2015    History reviewed. No pertinent surgical history.     Home Medications    Prior to Admission medications   Not on File    Family History History reviewed. No pertinent family history.  Social History Social History  Substance Use Topics  . Smoking status: Never Smoker  . Smokeless tobacco: Never Used  . Alcohol use No     Comment: occ     Allergies   Patient has no known allergies.   Review of Systems Review of Systems  Constitutional: Negative for fever.  HENT: Negative for congestion.   Eyes: Negative for redness.  Respiratory: Negative for shortness of breath.   Cardiovascular: Negative for chest pain.  Gastrointestinal: Negative for abdominal pain.  Genitourinary: Positive for difficulty urinating. Negative for decreased urine volume, discharge, dysuria, flank pain, frequency, hematuria and urgency.  Musculoskeletal: Negative for back pain.  Skin: Negative for rash.  Neurological: Negative for headaches.  Hematological: Does not bruise/bleed easily.  Psychiatric/Behavioral: Negative for confusion.     Physical Exam Updated Vital Signs BP 122/75 (BP Location: Left Arm)   Pulse 80   Temp 98.4 F (36.9 C) (Oral)   Resp 18   Ht  (1.702 m)   Wt  63.5 kg   SpO2 100%   BMI 21.93 kg/m   Physical Exam  Constitutional: He is oriented to person, place, and time. He appears well-developed and well-nourished. No distress.  HENT:  Head: Normocephalic and atraumatic.  Mouth/Throat: Oropharynx is clear and moist.  Eyes: EOM are normal. Pupils are equal, round, and reactive to light.  Neck: Normal range of motion. Neck supple.  Cardiovascular: Normal rate, regular rhythm and normal heart sounds.   Pulmonary/Chest: Effort normal and breath sounds normal. No respiratory distress.  Abdominal: Soft. Bowel sounds are normal. He exhibits no distension. There is no tenderness.  Genitourinary: Penis normal.  Genitourinary Comments: Circumcised. No discharge no lesions. Testicles descended bilaterally no hernia. External urethral meatus is a bit narrowed no evidence of trauma or injury.  Musculoskeletal: Normal range of motion.  Neurological: He is alert and oriented to person, place, and time. No cranial nerve deficit. He exhibits normal muscle tone. Coordination normal.  Nursing note and vitals reviewed.    ED Treatments / Results  Labs (all labs ordered are listed, but only abnormal results are displayed) Labs Reviewed  URINALYSIS, ROUTINE W REFLEX MICROSCOPIC    EKG  EKG Interpretation None       Radiology No results found.  Procedures Procedures (including critical care time)  Medications Ordered in ED Medications - No data to display   Initial Impression / Assessment and Plan / ED Course  I have reviewed the triage vital signs and  the nursing notes.  Pertinent labs & imaging results that were available during my care of the patient were reviewed by me and considered in my medical decision making (see chart for details).     Patient presents with a complaint of urinary retention or feeling like he can't he or void completely. Been going on for several weeks.  Patient without concerned about STD. No  discharge.  Urinalysis here negative for urinary tract infection. Post voiding bladder scan showed the only 60 mL. So no significant urinary retention.  On exam the urethral meatus is a bit narrowed possible that this could be the problem. Patient states he hasn't had trouble in the past. We'll have him follow-up with urology.  Final Clinical Impressions(s) / ED Diagnoses   Final diagnoses:  Difficulty urinating    New Prescriptions New Prescriptions   No medications on file     Vanetta Mulders, MD 07/20/16 1523    Vanetta Mulders, MD 07/20/16 1525

## 2016-07-20 NOTE — Discharge Instructions (Signed)
Follow-up with urology. No evidence urinary tract infection here today. No significant urinary retention.

## 2016-07-20 NOTE — ED Triage Notes (Signed)
Pt reports urinary retention, frequency x several weeks.  Also reports diarrhea.

## 2016-11-11 ENCOUNTER — Ambulatory Visit (HOSPITAL_COMMUNITY)
Admission: EM | Admit: 2016-11-11 | Discharge: 2016-11-11 | Disposition: A | Discharge status: Home / Self Care | Payer: PRIVATE HEALTH INSURANCE | Attending: Internal Medicine | Admitting: Internal Medicine

## 2016-11-11 ENCOUNTER — Encounter (HOSPITAL_COMMUNITY): Payer: Self-pay | Admitting: Emergency Medicine

## 2016-11-11 DIAGNOSIS — K0889 Other specified disorders of teeth and supporting structures: Secondary | ICD-10-CM

## 2016-11-11 DIAGNOSIS — K047 Periapical abscess without sinus: Secondary | ICD-10-CM

## 2016-11-11 DIAGNOSIS — K029 Dental caries, unspecified: Secondary | ICD-10-CM

## 2016-11-11 MED ORDER — PENICILLIN V POTASSIUM 500 MG PO TABS: 500.0000 mg | ORAL_TABLET | Freq: Four times a day (QID) | ORAL | 0 refills | Status: AC

## 2016-11-11 MED ORDER — KETOROLAC TROMETHAMINE 60 MG/2ML IM SOLN
INTRAMUSCULAR | Status: AC
  Filled 2016-11-11: qty 2

## 2016-11-11 MED ORDER — KETOROLAC TROMETHAMINE 60 MG/2ML IM SOLN
60.0000 mg | Freq: Once | INTRAMUSCULAR | Status: AC
  Administered 2016-11-11: 60 mg via INTRAMUSCULAR

## 2016-11-11 NOTE — ED Triage Notes (Signed)
3 day history of significant tooth pain.  Pain is bottom right.  This episode is worse than in the past.  Patient does not have a dentist.  25 days until insurance kicks in per patient

## 2016-11-11 NOTE — Discharge Instructions (Addendum)
Injection of ketorolac, anti-inflammatory/pain reliever, given at the urgent care today. Prescription for penicillin sent to the pharmacy. Recommend follow-up with a dentist in the next week or so, to discuss definitive management of the tooth.

## 2016-11-11 NOTE — ED Provider Notes (Signed)
[facility]    CSN: [id number] Arrival date & time: 11/11/16  4098     History   Chief Complaint Chief Complaint  Patient presents with  . Dental Pain    HPI Curtis [name] is a 26 y.o. male. He broke the right lower second molar about a month ago, actually had improvement in tooth pain at that time, but in the last 2 or 3 days has had marked increase in pain with throbbing. Subjective facial swelling. No fever. Has been holding off going to the dentist because of insurance issues. Review of the controlled substances Registry reveals that a prescription for oxycodone 30 mg, #120, was filled the patient's name on 10/07/2016.   HPI  No past medical history on file.  Patient Active Problem List   Diagnosis Date Noted  . Low back pain radiating to both legs 12/13/2015    History reviewed. No pertinent surgical history.     Home Medications    Prior to Admission medications   Medication Sig Start Date End Date Taking? Authorizing Provider  penicillin v potassium (VEETID) 500 MG tablet Take 1 tablet (500 mg total) by mouth 4 (four) times daily. 11/11/16 11/21/16  Eustace Moore, MD    Family History No family history on file.  Social History Social History  Substance Use Topics  . Smoking status: Never Smoker  . Smokeless tobacco: Never Used  . Alcohol use No     Comment: occ     Allergies   Patient has no known allergies.   Review of Systems Review of Systems  All other systems reviewed and are negative.    Physical Exam Triage Vital Signs ED Triage Vitals  Enc Vitals Group     BP 11/11/16 1015 129/77     Pulse Rate 11/11/16 1015 (!) 56     Resp 11/11/16 1015 18     Temp 11/11/16 1015 98.6 F (37 C)     Temp Source 11/11/16 1015 Oral     SpO2 11/11/16 1015 100 %     Weight --      Height --      Pain Score 11/11/16 1013 9     Pain Loc --    Updated Vital Signs BP 129/77 (BP Location: Right Arm)   Pulse (!) 56   Temp 98.6 F (37  C) (Oral)   Resp 18   SpO2 100%   Physical Exam  Constitutional: He is oriented to person, place, and time. No distress.  Alert, nicely groomed  HENT:  Head: Atraumatic.  Carious/broken tooth evident in the right lower jaw, second molar No trismus. Palpation of the gingiva is without focal swelling or tenderness.  Eyes:  Conjugate gaze, no eye redness/drainage  Neck: Neck supple.  Cardiovascular: Normal rate.   Pulmonary/Chest: No respiratory distress.  Abdominal: He exhibits no distension.  Musculoskeletal: Normal range of motion.  Neurological: He is alert and oriented to person, place, and time.  Skin: Skin is warm and dry.  No cyanosis  Nursing note and vitals reviewed.    UC Treatments / Results   Procedures Procedures (including critical care time)  Medications Ordered in UC Medications  ketorolac (TORADOL) injection 60 mg (60 mg Intramuscular Given 11/11/16 1054)     Final Clinical Impressions(s) / UC Diagnoses   Final diagnoses:  Pain due to dental caries  Dental infection   Injection of ketorolac, anti-inflammatory/pain reliever, given at the urgent care today. Prescription for penicillin sent to the  pharmacy. Recommend follow-up with a dentist in the next week or so, to discuss definitive management of the tooth.   New Prescriptions Discharge Medication List as of 11/11/2016 10:35 AM    START taking these medications   Details  penicillin v potassium (VEETID) 500 MG tablet Take 1 tablet (500 mg total) by mouth 4 (four) times daily., Starting Mon 11/11/2016, Until Thu 11/21/2016, Normal           Eustace MooreMurray, Darlin Stenseth W, MD 11/18/16 2025

## 2016-11-12 ENCOUNTER — Emergency Department (HOSPITAL_COMMUNITY)
Admission: EM | Admit: 2016-11-12 | Discharge: 2016-11-12 | Discharge status: Against Medical Advice | Payer: PRIVATE HEALTH INSURANCE | Attending: Emergency Medicine | Admitting: Emergency Medicine

## 2016-11-12 ENCOUNTER — Encounter (HOSPITAL_COMMUNITY): Payer: Self-pay | Admitting: Emergency Medicine

## 2016-11-12 DIAGNOSIS — K0889 Other specified disorders of teeth and supporting structures: Secondary | ICD-10-CM | POA: Insufficient documentation

## 2016-11-12 NOTE — ED Triage Notes (Signed)
Pt reports right lower tooth pain for the past few days, [facility] and given a pain injection and abx, states pain meds wore off. States he cannot see a Education officer, communitydentist until insurance kicks in in 25 days.

## 2016-11-27 ENCOUNTER — Emergency Department (HOSPITAL_BASED_OUTPATIENT_CLINIC_OR_DEPARTMENT_OTHER): Payer: Self-pay

## 2016-11-27 ENCOUNTER — Encounter (HOSPITAL_BASED_OUTPATIENT_CLINIC_OR_DEPARTMENT_OTHER): Payer: Self-pay | Admitting: *Deleted

## 2016-11-27 ENCOUNTER — Emergency Department (HOSPITAL_BASED_OUTPATIENT_CLINIC_OR_DEPARTMENT_OTHER)
Admission: EM | Admit: 2016-11-27 | Discharge: 2016-11-27 | Disposition: A | Discharge status: Home / Self Care | Payer: Self-pay | Attending: Emergency Medicine | Admitting: Emergency Medicine

## 2016-11-27 DIAGNOSIS — Y990 Civilian activity done for income or pay: Secondary | ICD-10-CM | POA: Insufficient documentation

## 2016-11-27 DIAGNOSIS — Y9289 Other specified places as the place of occurrence of the external cause: Secondary | ICD-10-CM | POA: Insufficient documentation

## 2016-11-27 DIAGNOSIS — M542 Cervicalgia: Secondary | ICD-10-CM | POA: Insufficient documentation

## 2016-11-27 DIAGNOSIS — S0083XA Contusion of other part of head, initial encounter: Secondary | ICD-10-CM | POA: Insufficient documentation

## 2016-11-27 DIAGNOSIS — Y9389 Activity, other specified: Secondary | ICD-10-CM | POA: Insufficient documentation

## 2016-11-27 MED ORDER — IBUPROFEN 800 MG PO TABS
800.0000 mg | ORAL_TABLET | Freq: Once | ORAL | Status: AC
  Administered 2016-11-27: 800 mg via ORAL
  Filled 2016-11-27: qty 1

## 2016-11-27 MED ORDER — ACETAMINOPHEN 500 MG PO TABS
1000.0000 mg | ORAL_TABLET | Freq: Once | ORAL | Status: AC
  Administered 2016-11-27: 1000 mg via ORAL
  Filled 2016-11-27: qty 2

## 2016-11-27 MED ORDER — OXYCODONE HCL 5 MG PO TABS
5.0000 mg | ORAL_TABLET | Freq: Once | ORAL | Status: AC
  Administered 2016-11-27: 5 mg via ORAL
  Filled 2016-11-27: qty 1
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
## 2016-11-27 NOTE — ED Notes (Signed)
ED Provider at bedside. 

## 2016-11-27 NOTE — ED Notes (Signed)
Patient transported to CT 
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
## 2016-11-27 NOTE — Discharge Instructions (Signed)
Take 4 over the counter ibuprofen tablets 3 times a day or 2 over-the-counter naproxen tablets twice a day for pain. Also take tylenol 1000mg(2 extra strength) four times a day.    

## 2016-12-13 ENCOUNTER — Encounter (HOSPITAL_BASED_OUTPATIENT_CLINIC_OR_DEPARTMENT_OTHER): Payer: Self-pay

## 2016-12-13 ENCOUNTER — Emergency Department (HOSPITAL_BASED_OUTPATIENT_CLINIC_OR_DEPARTMENT_OTHER)
Admission: EM | Admit: 2016-12-13 | Discharge: 2016-12-13 | Disposition: A | Discharge status: Home / Self Care | Payer: PRIVATE HEALTH INSURANCE | Attending: Emergency Medicine | Admitting: Emergency Medicine

## 2016-12-13 DIAGNOSIS — R3911 Hesitancy of micturition: Secondary | ICD-10-CM | POA: Insufficient documentation

## 2016-12-13 LAB — URINALYSIS, MICROSCOPIC (REFLEX)

## 2016-12-13 LAB — URINALYSIS, ROUTINE W REFLEX MICROSCOPIC
Bilirubin Urine: NEGATIVE
Glucose, UA: NEGATIVE mg/dL
HGB URINE DIPSTICK: NEGATIVE
Ketones, ur: NEGATIVE mg/dL
Nitrite: NEGATIVE
PROTEIN: NEGATIVE mg/dL
Specific Gravity, Urine: 1.02 (ref 1.005–1.030)
pH: 6 (ref 5.0–8.0)

## 2016-12-13 NOTE — ED Provider Notes (Signed)
[facility]   CSN: [id number] Arrival date & time: 12/13/16  0036     History   Chief Complaint Chief Complaint  Patient presents with  . Urinary Retention    HPI Curtis [name] is a 26 y.o. male.  HPI Patient feels as though he is not emptying his bladder completely.  This is been going on over the past 12 hours.  He denies dysuria but reports some urinary frequency.  No back pain or flank pain.  No nausea vomiting diarrhea.  No fevers or chills.  No history of urinary retention.  No new medications.   History reviewed. No pertinent past medical history.  Patient Active Problem List   Diagnosis Date Noted  . Low back pain radiating to both legs 12/13/2015    History reviewed. No pertinent surgical history.     Home Medications    Prior to Admission medications   Not on File    Family History No family history on file.  Social History Social History  Substance Use Topics  . Smoking status: Never Smoker  . Smokeless tobacco: Never Used  . Alcohol use No     Comment: occ     Allergies   Patient has no known allergies.   Review of Systems Review of Systems  All other systems reviewed and are negative.    Physical Exam Updated Vital Signs BP 130/78 (BP Location: Left Arm)   Pulse 71   Temp 98.4 F (36.9 C) (Oral)   Resp 16   Ht  (1.702 m)   Wt 70.8 kg (156 lb)   SpO2 100%   BMI 24.43 kg/m   Physical Exam  Constitutional: He is oriented to person, place, and time. He appears well-developed and well-nourished.  HENT:  Head: Normocephalic.  Eyes: EOM are normal.  Neck: Normal range of motion.  Pulmonary/Chest: Effort normal.  Abdominal: Soft. He exhibits no distension. There is no tenderness.  Musculoskeletal: Normal range of motion.  Neurological: He is alert and oriented to person, place, and time.  Psychiatric: He has a normal mood and affect.  Nursing note and vitals reviewed.    ED Treatments /  Results  Labs (all labs ordered are listed, but only abnormal results are displayed) Labs Reviewed  URINALYSIS, ROUTINE W REFLEX MICROSCOPIC - Abnormal; Notable for the following:       Result Value   Leukocytes, UA TRACE (*)    All other components within normal limits  URINALYSIS, MICROSCOPIC (REFLEX) - Abnormal; Notable for the following:    Bacteria, UA FEW (*)    Squamous Epithelial / LPF 0-5 (*)    All other components within normal limits    EKG  EKG Interpretation None       Radiology No results found.  Procedures Procedures (including critical care time)  Medications Ordered in ED Medications - No data to display   Initial Impression / Assessment and Plan / ED Course  I have reviewed the triage vital signs and the nursing notes.  Pertinent labs & imaging results that were available during my care of the patient were reviewed by me and considered in my medical decision making (see chart for details).     No clear etiology for his symptoms.  Well-appearing.  Urine without signs of infection.  Urology follow-up as needed.  Final Clinical Impressions(s) / ED Diagnoses   Final diagnoses:  Urinary hesitancy    New Prescriptions New Prescriptions   No medications on  file     Azalia Bilisampos, Ayanah Snader, MD 12/13/16 [phone]

## 2016-12-13 NOTE — ED Triage Notes (Signed)
Pt c/o burning on urination and bladder fullness when sitting

## 2016-12-13 NOTE — ED Notes (Signed)
Pt verbalizes understanding of d/c instructions and denies any further needs at this time. 

## 2017-01-18 ENCOUNTER — Emergency Department (HOSPITAL_COMMUNITY): Payer: Self-pay

## 2017-01-18 ENCOUNTER — Encounter (HOSPITAL_COMMUNITY): Payer: Self-pay

## 2017-01-18 ENCOUNTER — Emergency Department (HOSPITAL_COMMUNITY)
Admission: EM | Admit: 2017-01-18 | Discharge: 2017-01-18 | Disposition: A | Discharge status: Home / Self Care | Payer: Self-pay | Attending: Emergency Medicine | Admitting: Emergency Medicine

## 2017-01-18 DIAGNOSIS — W3400XA Accidental discharge from unspecified firearms or gun, initial encounter: Secondary | ICD-10-CM | POA: Insufficient documentation

## 2017-01-18 DIAGNOSIS — S81801A Unspecified open wound, right lower leg, initial encounter: Secondary | ICD-10-CM | POA: Insufficient documentation

## 2017-01-18 DIAGNOSIS — Y929 Unspecified place or not applicable: Secondary | ICD-10-CM | POA: Insufficient documentation

## 2017-01-18 DIAGNOSIS — Z23 Encounter for immunization: Secondary | ICD-10-CM | POA: Insufficient documentation

## 2017-01-18 DIAGNOSIS — S81831A Puncture wound without foreign body, right lower leg, initial encounter: Secondary | ICD-10-CM

## 2017-01-18 DIAGNOSIS — Y9389 Activity, other specified: Secondary | ICD-10-CM | POA: Insufficient documentation

## 2017-01-18 DIAGNOSIS — Y998 Other external cause status: Secondary | ICD-10-CM | POA: Insufficient documentation

## 2017-01-18 LAB — I-STAT CG4 LACTIC ACID, ED
LACTIC ACID, VENOUS: 1.09 mmol/L (ref 0.5–1.9)
Lactic Acid, Venous: 3.22 mmol/L (ref 0.5–1.9)

## 2017-01-18 LAB — PROTIME-INR
INR: 0.97
Prothrombin Time: 12.8 seconds (ref 11.4–15.2)

## 2017-01-18 LAB — CBC WITH DIFFERENTIAL/PLATELET
Basophils Absolute: 0 10*3/uL (ref 0.0–0.1)
Basophils Relative: 0 %
Eosinophils Absolute: 0.1 10*3/uL (ref 0.0–0.7)
Eosinophils Relative: 1 %
HEMATOCRIT: 39 % (ref 39.0–52.0)
HEMOGLOBIN: 13.6 g/dL (ref 13.0–17.0)
LYMPHS PCT: 27 %
Lymphs Abs: 2.1 10*3/uL (ref 0.7–4.0)
MCH: 32.2 pg (ref 26.0–34.0)
MCHC: 34.9 g/dL (ref 30.0–36.0)
MCV: 92.4 fL (ref 78.0–100.0)
MONO ABS: 0.7 10*3/uL (ref 0.1–1.0)
MONOS PCT: 10 %
Neutro Abs: 4.7 10*3/uL (ref 1.7–7.7)
Neutrophils Relative %: 62 %
Platelets: 155 10*3/uL (ref 150–400)
RBC: 4.22 MIL/uL (ref 4.22–5.81)
RDW: 12.8 % (ref 11.5–15.5)
WBC: 7.6 10*3/uL (ref 4.0–10.5)

## 2017-01-18 LAB — BASIC METABOLIC PANEL
Anion gap: 11 (ref 5–15)
BUN: 9 mg/dL (ref 6–20)
CALCIUM: 9.1 mg/dL (ref 8.9–10.3)
CO2: 24 mmol/L (ref 22–32)
Chloride: 106 mmol/L (ref 101–111)
Creatinine, Ser: 1.13 mg/dL (ref 0.61–1.24)
GFR calc Af Amer: >60 mL/min (ref >60)
GLUCOSE: 91 mg/dL (ref 65–99)
POTASSIUM: 3.5 mmol/L (ref 3.5–5.1)
Sodium: 141 mmol/L (ref 135–145)

## 2017-01-18 LAB — TYPE AND SCREEN
ABO/RH(D): O POS
Antibody Screen: NEGATIVE

## 2017-01-18 MED ORDER — FENTANYL CITRATE (PF) 100 MCG/2ML IJ SOLN
50.0000 ug | Freq: Once | INTRAMUSCULAR | Status: AC
  Administered 2017-01-18: 50 ug via INTRAVENOUS
  Filled 2017-01-18: qty 2

## 2017-01-18 MED ORDER — BACITRACIN ZINC 500 UNIT/GM EX OINT
1.0000 "application " | TOPICAL_OINTMENT | Freq: Two times a day (BID) | CUTANEOUS | Status: DC
  Administered 2017-01-18: 1 via TOPICAL
  Filled 2017-01-18: qty 1.8

## 2017-01-18 MED ORDER — BACITRACIN ZINC 500 UNIT/GM EX OINT: 1.0000 "application " | TOPICAL_OINTMENT | Freq: Three times a day (TID) | CUTANEOUS | 1 refills | Status: DC

## 2017-01-18 MED ORDER — SODIUM CHLORIDE 0.9 % IV BOLUS (SEPSIS)
1000.0000 mL | Freq: Once | INTRAVENOUS | Status: AC
  Administered 2017-01-18: 1000 mL via INTRAVENOUS

## 2017-01-18 MED ORDER — TETANUS-DIPHTH-ACELL PERTUSSIS 5-2.5-18.5 LF-MCG/0.5 IM SUSP
0.5000 mL | Freq: Once | INTRAMUSCULAR | Status: AC
  Administered 2017-01-18: 0.5 mL via INTRAMUSCULAR
  Filled 2017-01-18: qty 0.5

## 2017-01-18 MED ORDER — NAPROXEN 375 MG PO TABS: 375.0000 mg | ORAL_TABLET | Freq: Two times a day (BID) | ORAL | 0 refills | Status: DC

## 2017-01-18 NOTE — ED Notes (Signed)
Patient transported to X-ray 
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
## 2017-01-19 LAB — ABO/RH: ABO/RH(D): O POS

## 2017-01-21 ENCOUNTER — Telehealth (HOSPITAL_COMMUNITY): Payer: Self-pay

## 2017-01-21 ENCOUNTER — Emergency Department (HOSPITAL_COMMUNITY)
Admission: EM | Admit: 2017-01-21 | Discharge: 2017-01-21 | Disposition: A | Discharge status: Home / Self Care | Payer: Self-pay | Attending: Emergency Medicine | Admitting: Emergency Medicine

## 2017-01-21 ENCOUNTER — Emergency Department (HOSPITAL_COMMUNITY): Payer: Self-pay

## 2017-01-21 ENCOUNTER — Encounter (HOSPITAL_COMMUNITY): Payer: Self-pay

## 2017-01-21 DIAGNOSIS — S71102D Unspecified open wound, left thigh, subsequent encounter: Secondary | ICD-10-CM | POA: Insufficient documentation

## 2017-01-21 DIAGNOSIS — M79605 Pain in left leg: Secondary | ICD-10-CM

## 2017-01-21 DIAGNOSIS — Y929 Unspecified place or not applicable: Secondary | ICD-10-CM | POA: Insufficient documentation

## 2017-01-21 DIAGNOSIS — Y939 Activity, unspecified: Secondary | ICD-10-CM | POA: Insufficient documentation

## 2017-01-21 DIAGNOSIS — Y999 Unspecified external cause status: Secondary | ICD-10-CM | POA: Insufficient documentation

## 2017-01-21 DIAGNOSIS — S71132D Puncture wound without foreign body, left thigh, subsequent encounter: Secondary | ICD-10-CM

## 2017-01-21 DIAGNOSIS — W3400XD Accidental discharge from unspecified firearms or gun, subsequent encounter: Secondary | ICD-10-CM | POA: Insufficient documentation

## 2017-01-21 MED ORDER — CEPHALEXIN 500 MG PO CAPS: 500.0000 mg | ORAL_CAPSULE | Freq: Four times a day (QID) | ORAL | 0 refills | Status: DC
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
## 2017-01-21 NOTE — ED Notes (Signed)
Pt ambulatory to bathroom at this time.
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
## 2017-01-21 NOTE — Discharge Instructions (Signed)
Please complete antibiotics as prescribed. Continue to use Naprosyn for pain. I encouraged you to stay off your feet as much as possible, please wear compression stockings to help with pain and inflammation. Please follow up with the trauma clinic next week. If you developed fevers or chills, worsening redness, leg is persistently numb or you're unable to walk please return to the emergency department.

## 2017-01-21 NOTE — Telephone Encounter (Signed)
[phone]  Curtis [name] states he needs his appointment moved up because he is in extreme pain.  He is in the E.D. right now.
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
## 2017-01-22 NOTE — Telephone Encounter (Signed)
Returned call to patient. Advised that we will have to get procedure approved. Contacted office who is working on this, and they will contact him with appointment. No further needs at this time.

## 2017-11-30 ENCOUNTER — Emergency Department (HOSPITAL_BASED_OUTPATIENT_CLINIC_OR_DEPARTMENT_OTHER)
Admission: EM | Admit: 2017-11-30 | Discharge: 2017-11-30 | Disposition: A | Discharge status: Home / Self Care | Payer: PRIVATE HEALTH INSURANCE | Attending: Emergency Medicine | Admitting: Emergency Medicine

## 2017-11-30 ENCOUNTER — Other Ambulatory Visit: Payer: Self-pay

## 2017-11-30 ENCOUNTER — Encounter (HOSPITAL_BASED_OUTPATIENT_CLINIC_OR_DEPARTMENT_OTHER): Payer: Self-pay | Admitting: *Deleted

## 2017-11-30 DIAGNOSIS — M792 Neuralgia and neuritis, unspecified: Secondary | ICD-10-CM

## 2017-11-30 DIAGNOSIS — M5417 Radiculopathy, lumbosacral region: Secondary | ICD-10-CM | POA: Insufficient documentation

## 2017-11-30 DIAGNOSIS — Z79899 Other long term (current) drug therapy: Secondary | ICD-10-CM | POA: Insufficient documentation

## 2017-11-30 HISTORY — DX: Unspecified firearm discharge, undetermined intent, initial encounter: Y24.9XXA

## 2017-11-30 HISTORY — DX: Accidental discharge from unspecified firearms or gun, initial encounter: W34.00XA

## 2017-11-30 MED ORDER — GABAPENTIN 100 MG PO CAPS: 100.0000 mg | ORAL_CAPSULE | Freq: Three times a day (TID) | ORAL | 0 refills | Status: DC

## 2017-11-30 MED ORDER — NAPROXEN 500 MG PO TABS: 500.0000 mg | ORAL_TABLET | Freq: Two times a day (BID) | ORAL | 0 refills | Status: DC
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
## 2017-11-30 NOTE — Discharge Instructions (Signed)
1.  Schedule appointment with a family doctor to help coordinate your care.  Use the referral number in your discharge instructions to find one. 2.  Follow-up with Dr. [facility].  You may benefit from physical therapy or referral to a spine specialist if needed.
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
## 2017-12-08 ENCOUNTER — Encounter (HOSPITAL_BASED_OUTPATIENT_CLINIC_OR_DEPARTMENT_OTHER): Payer: Self-pay

## 2017-12-08 ENCOUNTER — Emergency Department (HOSPITAL_BASED_OUTPATIENT_CLINIC_OR_DEPARTMENT_OTHER)
Admission: EM | Admit: 2017-12-08 | Discharge: 2017-12-08 | Disposition: A | Discharge status: Home / Self Care | Payer: PRIVATE HEALTH INSURANCE | Attending: Emergency Medicine | Admitting: Emergency Medicine

## 2017-12-08 ENCOUNTER — Other Ambulatory Visit: Payer: Self-pay

## 2017-12-08 DIAGNOSIS — R253 Fasciculation: Secondary | ICD-10-CM | POA: Insufficient documentation

## 2017-12-08 DIAGNOSIS — Z79899 Other long term (current) drug therapy: Secondary | ICD-10-CM | POA: Insufficient documentation

## 2017-12-08 DIAGNOSIS — T887XXA Unspecified adverse effect of drug or medicament, initial encounter: Secondary | ICD-10-CM

## 2017-12-08 DIAGNOSIS — Y69 Unspecified misadventure during surgical and medical care: Secondary | ICD-10-CM | POA: Insufficient documentation
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
## 2017-12-08 NOTE — ED Triage Notes (Signed)
Pt c/o "right arm jumping" x 3 days after starting taking med for "nerve damage" to left LE-NAD-steady gait

## 2017-12-08 NOTE — Discharge Instructions (Signed)
Stop taking the gabapentin and muscle twitching should resolve.

## 2017-12-09 ENCOUNTER — Encounter: Payer: Self-pay | Admitting: Neurology
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
## 2018-01-02 ENCOUNTER — Encounter: Payer: Self-pay | Admitting: Neurology

## 2018-01-02 ENCOUNTER — Ambulatory Visit (INDEPENDENT_AMBULATORY_CARE_PROVIDER_SITE_OTHER): Payer: Self-pay | Admitting: Neurology

## 2018-01-02 VITALS — BP 112/68 | HR 59 | Ht 67.5 in | Wt 145.0 lb

## 2018-01-02 DIAGNOSIS — M792 Neuralgia and neuritis, unspecified: Secondary | ICD-10-CM

## 2018-01-02 DIAGNOSIS — Z029 Encounter for administrative examinations, unspecified: Secondary | ICD-10-CM

## 2018-01-02 MED ORDER — DULOXETINE HCL 30 MG PO CPEP: ORAL_CAPSULE | ORAL | 0 refills | Status: DC

## 2018-01-02 MED ORDER — DULOXETINE HCL 30 MG PO CPEP: 30.0000 mg | ORAL_CAPSULE | Freq: Every day | ORAL | 3 refills | Status: DC

## 2018-01-02 NOTE — Patient Instructions (Signed)
1.  To help with nerve pain, start duloxetine 30mg .  Take 1 capsule daily for 7 days, then increase to 2 capsules daily.  If pain persists in 2 months, contact me and we can increase dose. 2.  Will fill out forms for work.  You may pick it up on Tuesday of next week. 3.  Try using over the counter lidocaine patches 4.  Follow up in 4 months.

## 2018-01-06 ENCOUNTER — Telehealth: Payer: Self-pay

## 2018-01-06 NOTE — Telephone Encounter (Signed)
Called Pt, LMOVM advising paperwork for work is ready for pick up

## 2018-01-14 ENCOUNTER — Other Ambulatory Visit: Payer: Self-pay | Admitting: *Deleted

## 2018-01-14 ENCOUNTER — Telehealth: Payer: Self-pay | Admitting: Neurology

## 2018-01-14 MED ORDER — DULOXETINE HCL 60 MG PO CPEP: ORAL_CAPSULE | ORAL | 5 refills | Status: DC

## 2018-01-14 NOTE — Telephone Encounter (Signed)
Patient states medications Dr.Jaffe prescribed on 9.27.19 were sent to the wrong pharmacy and need them resent to [facility] pharmacy in [place].

## 2018-01-14 NOTE — Telephone Encounter (Signed)
New Rx sent to [facility].

## 2018-01-30 ENCOUNTER — Encounter

## 2018-01-30 ENCOUNTER — Ambulatory Visit: Payer: Self-pay | Admitting: Neurology
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
## 2018-05-04 ENCOUNTER — Ambulatory Visit: Payer: Self-pay | Admitting: Neurology

## 2019-01-12 IMAGING — CR DG CHEST 2V
2 series · 2 of 2 positions shown · non-contrast
Comparison: 09/21/2013

CLINICAL DATA: Cough and chest congestion and shortness of breath.

EXAM:
CHEST  2 VIEW

[w chest pa]
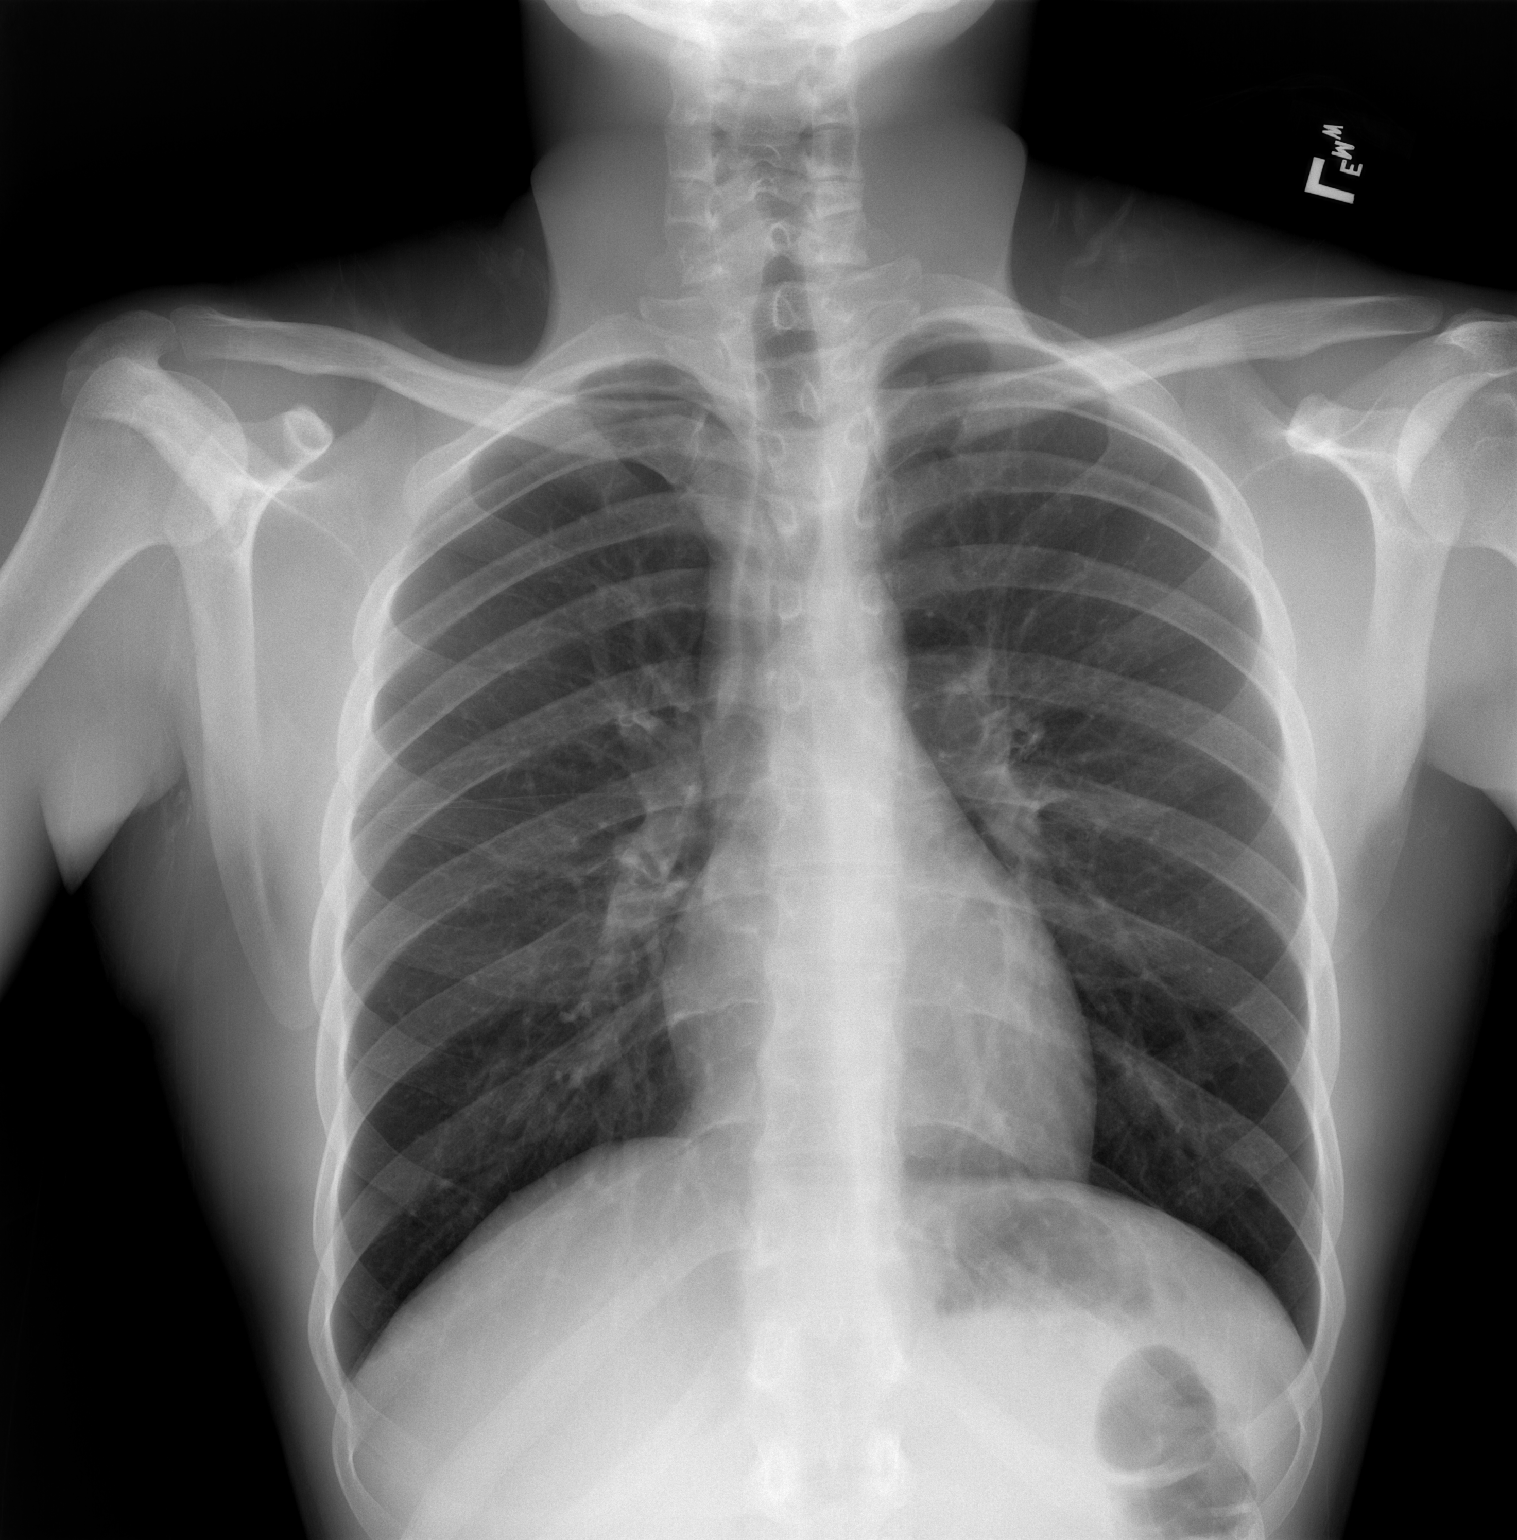

[w chest lat]
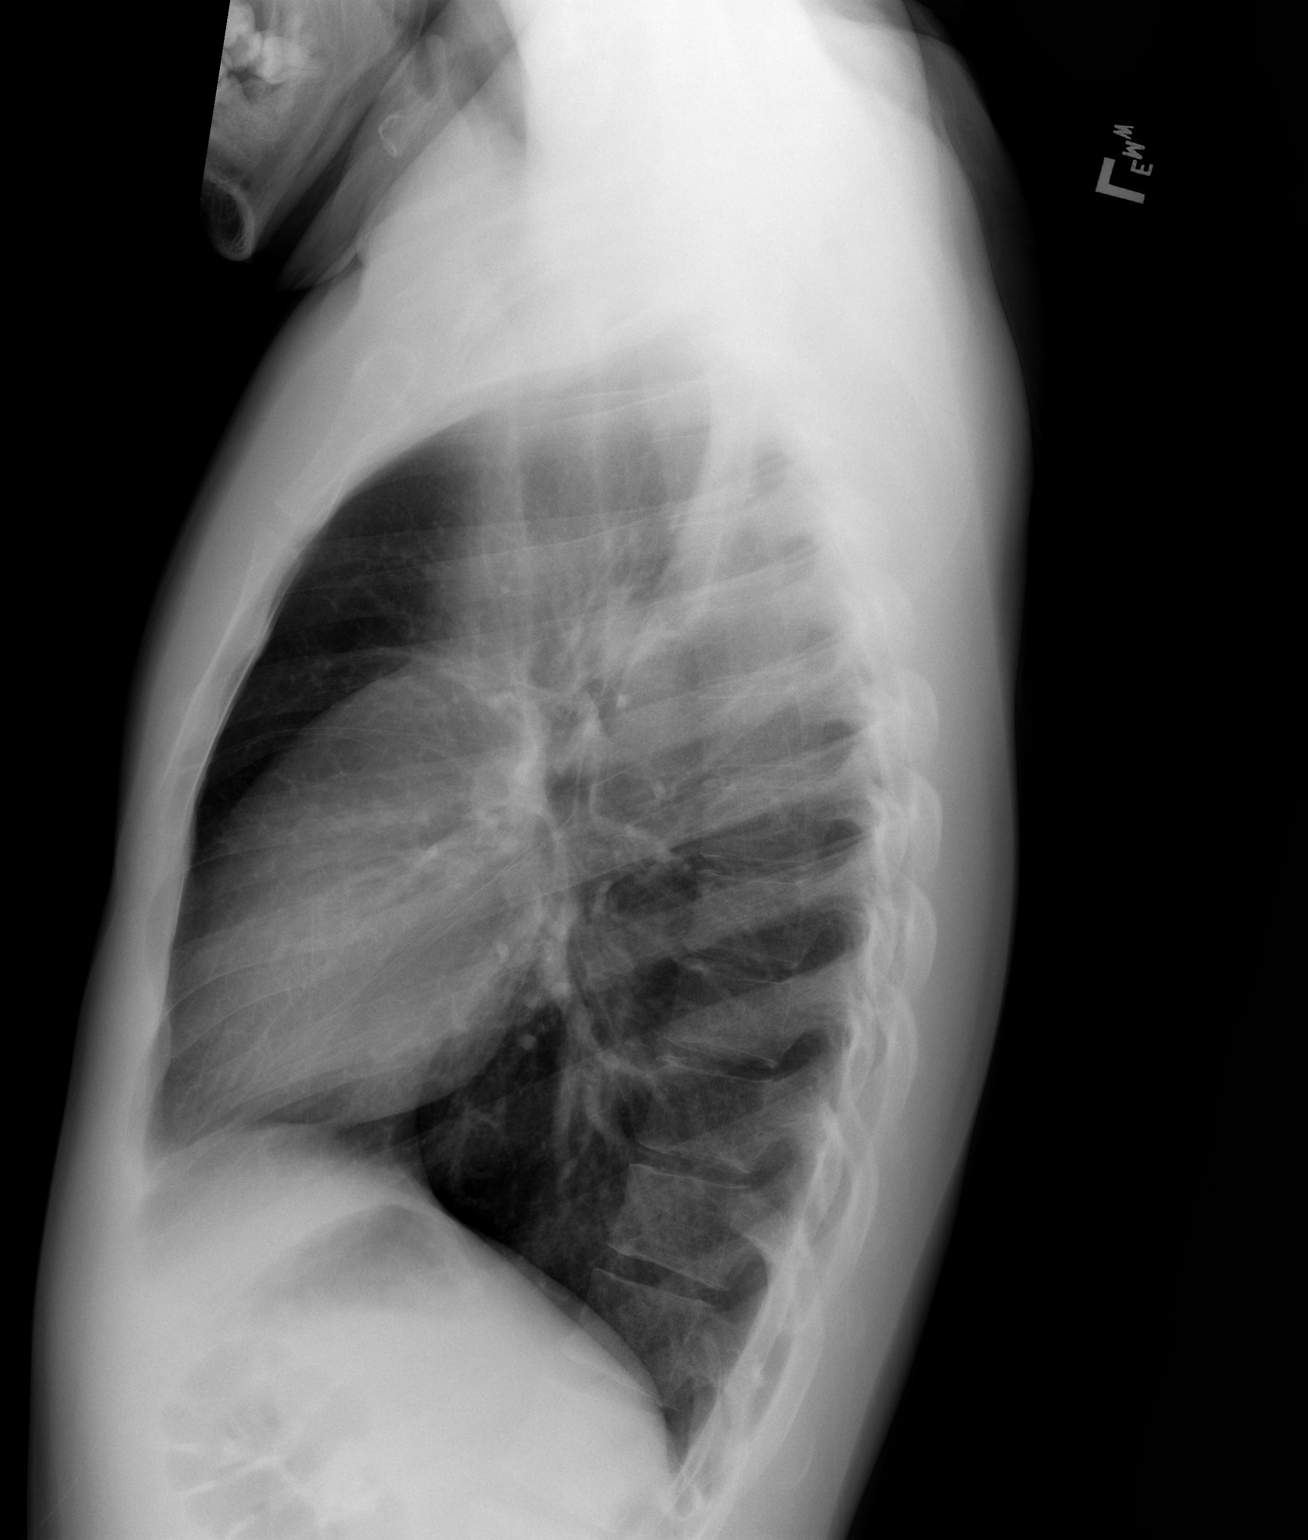

[2 of 2 positions shown; findings below may reference images not displayed]

FINDINGS: The heart size and mediastinal contours are within normal limits.
Both lungs are clear. The visualized skeletal structures are
unremarkable.
IMPRESSION: Normal chest.

## 2019-07-22 ENCOUNTER — Emergency Department (HOSPITAL_BASED_OUTPATIENT_CLINIC_OR_DEPARTMENT_OTHER)
Admission: EM | Admit: 2019-07-22 | Discharge: 2019-07-22 | Disposition: A | Discharge status: Home / Self Care | Payer: Self-pay | Attending: Emergency Medicine | Admitting: Emergency Medicine

## 2019-07-22 ENCOUNTER — Encounter (HOSPITAL_BASED_OUTPATIENT_CLINIC_OR_DEPARTMENT_OTHER): Payer: Self-pay | Admitting: Emergency Medicine

## 2019-07-22 ENCOUNTER — Other Ambulatory Visit: Payer: Self-pay

## 2019-07-22 ENCOUNTER — Emergency Department (HOSPITAL_BASED_OUTPATIENT_CLINIC_OR_DEPARTMENT_OTHER): Payer: Self-pay

## 2019-07-22 DIAGNOSIS — R197 Diarrhea, unspecified: Secondary | ICD-10-CM | POA: Insufficient documentation

## 2019-07-22 DIAGNOSIS — R1033 Periumbilical pain: Secondary | ICD-10-CM | POA: Insufficient documentation

## 2019-07-22 DIAGNOSIS — F1210 Cannabis abuse, uncomplicated: Secondary | ICD-10-CM | POA: Insufficient documentation

## 2019-07-22 DIAGNOSIS — R112 Nausea with vomiting, unspecified: Secondary | ICD-10-CM | POA: Insufficient documentation

## 2019-07-22 LAB — URINALYSIS, ROUTINE W REFLEX MICROSCOPIC
Bilirubin Urine: NEGATIVE
Glucose, UA: NEGATIVE mg/dL
Hgb urine dipstick: NEGATIVE
Ketones, ur: NEGATIVE mg/dL
Leukocytes,Ua: NEGATIVE
Nitrite: NEGATIVE
Protein, ur: NEGATIVE mg/dL
Specific Gravity, Urine: 1.025 (ref 1.005–1.030)
pH: 6.5 (ref 5.0–8.0)

## 2019-07-22 LAB — COMPREHENSIVE METABOLIC PANEL
ALT: 13 U/L (ref 0–44)
AST: 13 U/L — ABNORMAL LOW (ref 15–41)
Albumin: 4.6 g/dL (ref 3.5–5.0)
Alkaline Phosphatase: 46 U/L (ref 38–126)
Anion gap: 8 (ref 5–15)
BUN: 13 mg/dL (ref 6–20)
CO2: 27 mmol/L (ref 22–32)
Calcium: 9.2 mg/dL (ref 8.9–10.3)
Chloride: 107 mmol/L (ref 98–111)
Creatinine, Ser: 1.03 mg/dL (ref 0.61–1.24)
GFR calc Af Amer: >60 mL/min (ref >60)
GFR calc non Af Amer: >60 mL/min (ref >60)
Glucose, Bld: 97 mg/dL (ref 70–99)
Potassium: 4 mmol/L (ref 3.5–5.1)
Sodium: 142 mmol/L (ref 135–145)
Total Bilirubin: 1.2 mg/dL (ref 0.3–1.2)
Total Protein: 7 g/dL (ref 6.5–8.1)

## 2019-07-22 LAB — CBC
HCT: 41.8 % (ref 39.0–52.0)
Hemoglobin: 14.1 g/dL (ref 13.0–17.0)
MCH: 32.4 pg (ref 26.0–34.0)
MCHC: 33.7 g/dL (ref 30.0–36.0)
MCV: 96.1 fL (ref 80.0–100.0)
Platelets: 143 10*3/uL — ABNORMAL LOW (ref 150–400)
RBC: 4.35 MIL/uL (ref 4.22–5.81)
RDW: 12.5 % (ref 11.5–15.5)
WBC: 7.3 10*3/uL (ref 4.0–10.5)
nRBC: 0 % (ref 0.0–0.2)

## 2019-07-22 LAB — LIPASE, BLOOD: Lipase: 23 U/L (ref 11–51)

## 2019-07-22 MED ORDER — PANTOPRAZOLE SODIUM 20 MG PO TBEC: 20.0000 mg | DELAYED_RELEASE_TABLET | Freq: Every day | ORAL | 0 refills | Status: DC

## 2019-07-22 MED ORDER — IOHEXOL 300 MG/ML  SOLN
100.0000 mL | Freq: Once | INTRAMUSCULAR | Status: AC | PRN
  Administered 2019-07-22: 100 mL via INTRAVENOUS

## 2019-07-22 MED ORDER — DICYCLOMINE HCL 20 MG PO TABS: 20.0000 mg | ORAL_TABLET | Freq: Two times a day (BID) | ORAL | 0 refills | Status: DC

## 2019-07-22 MED ORDER — SODIUM CHLORIDE 0.9 % IV BOLUS
1000.0000 mL | Freq: Once | INTRAVENOUS | Status: AC
  Administered 2019-07-22: 11:00:00 1000 mL via INTRAVENOUS

## 2019-07-22 MED ORDER — ONDANSETRON HCL 4 MG/2ML IJ SOLN
4.0000 mg | Freq: Once | INTRAMUSCULAR | Status: AC
  Administered 2019-07-22: 4 mg via INTRAVENOUS
  Filled 2019-07-22: qty 2

## 2019-07-22 MED ORDER — ONDANSETRON 4 MG PO TBDP: 4.0000 mg | ORAL_TABLET | Freq: Three times a day (TID) | ORAL | 0 refills | Status: DC | PRN
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
## 2019-07-22 NOTE — ED Triage Notes (Signed)
LLQ pain x 1 month. Vomiting x 3 days. Denies urinary symptoms

## 2019-07-22 NOTE — ED Notes (Signed)
Patient transported to CT 

## 2019-07-22 NOTE — Discharge Instructions (Signed)
Your labwork and imaging were reassuring today. Please call [facility] to schedule an appointment for further evaluation given your pain has been ongoing for a month.   Pick up medication and take as needed for abdominal pain and nausea. Eat a bland diet for the next couple of days given your vomiting; this will help ease your stomach.   Return to the ED IMMEDIATELY for any worsening symptoms including worsening pain, excessive vomiting, vomiting blood or what looks like ground up coffee beans, blood in your stool, fevers > 100.4, or any other concerning symptoms.

## 2019-07-22 NOTE — ED Notes (Signed)
ED Provider at bedside. 

## 2019-11-04 ENCOUNTER — Other Ambulatory Visit: Payer: Self-pay

## 2019-11-04 ENCOUNTER — Emergency Department (HOSPITAL_COMMUNITY)
Admission: EM | Admit: 2019-11-04 | Discharge: 2019-11-04 | Disposition: A | Discharge status: Home / Self Care | Payer: Self-pay | Attending: Emergency Medicine | Admitting: Emergency Medicine

## 2019-11-04 DIAGNOSIS — R1110 Vomiting, unspecified: Secondary | ICD-10-CM | POA: Insufficient documentation

## 2019-11-04 DIAGNOSIS — R109 Unspecified abdominal pain: Secondary | ICD-10-CM | POA: Insufficient documentation

## 2019-11-04 DIAGNOSIS — R197 Diarrhea, unspecified: Secondary | ICD-10-CM | POA: Insufficient documentation

## 2019-11-04 LAB — COMPREHENSIVE METABOLIC PANEL
ALT: 15 U/L (ref 0–44)
AST: 14 U/L — ABNORMAL LOW (ref 15–41)
Albumin: 4.3 g/dL (ref 3.5–5.0)
Alkaline Phosphatase: 50 U/L (ref 38–126)
Anion gap: 8 (ref 5–15)
BUN: 15 mg/dL (ref 6–20)
CO2: 26 mmol/L (ref 22–32)
Calcium: 9.1 mg/dL (ref 8.9–10.3)
Chloride: 106 mmol/L (ref 98–111)
Creatinine, Ser: 0.94 mg/dL (ref 0.61–1.24)
GFR calc Af Amer: >60 mL/min (ref >60)
GFR calc non Af Amer: >60 mL/min (ref >60)
Glucose, Bld: 101 mg/dL — ABNORMAL HIGH (ref 70–99)
Potassium: 3.7 mmol/L (ref 3.5–5.1)
Sodium: 140 mmol/L (ref 135–145)
Total Bilirubin: 0.6 mg/dL (ref 0.3–1.2)
Total Protein: 6.9 g/dL (ref 6.5–8.1)

## 2019-11-04 LAB — CBC WITH DIFFERENTIAL/PLATELET
Abs Immature Granulocytes: 0.06 10*3/uL (ref 0.00–0.07)
Basophils Absolute: 0 10*3/uL (ref 0.0–0.1)
Basophils Relative: 0 %
Eosinophils Absolute: 0.1 10*3/uL (ref 0.0–0.5)
Eosinophils Relative: 1 %
HCT: 39.3 % (ref 39.0–52.0)
Hemoglobin: 13.5 g/dL (ref 13.0–17.0)
Immature Granulocytes: 1 %
Lymphocytes Relative: 12 %
Lymphs Abs: 1.5 10*3/uL (ref 0.7–4.0)
MCH: 32.4 pg (ref 26.0–34.0)
MCHC: 34.4 g/dL (ref 30.0–36.0)
MCV: 94.2 fL (ref 80.0–100.0)
Monocytes Absolute: 0.8 10*3/uL (ref 0.1–1.0)
Monocytes Relative: 6 %
Neutro Abs: 10.7 10*3/uL — ABNORMAL HIGH (ref 1.7–7.7)
Neutrophils Relative %: 80 %
Platelets: 173 10*3/uL (ref 150–400)
RBC: 4.17 MIL/uL — ABNORMAL LOW (ref 4.22–5.81)
RDW: 12.4 % (ref 11.5–15.5)
WBC: 13.2 10*3/uL — ABNORMAL HIGH (ref 4.0–10.5)
nRBC: 0 % (ref 0.0–0.2)

## 2019-11-04 LAB — URINALYSIS, ROUTINE W REFLEX MICROSCOPIC
Bilirubin Urine: NEGATIVE
Glucose, UA: NEGATIVE mg/dL
Hgb urine dipstick: NEGATIVE
Ketones, ur: NEGATIVE mg/dL
Leukocytes,Ua: NEGATIVE
Nitrite: NEGATIVE
Protein, ur: 30 mg/dL — AB
Specific Gravity, Urine: 1.03 (ref 1.005–1.030)
pH: 7 (ref 5.0–8.0)

## 2019-11-04 LAB — LIPASE, BLOOD: Lipase: 41 U/L (ref 11–51)

## 2019-11-04 MED ORDER — ONDANSETRON HCL 4 MG/2ML IJ SOLN
4.0000 mg | Freq: Once | INTRAMUSCULAR | Status: AC
  Administered 2019-11-04: 4 mg via INTRAVENOUS
  Filled 2019-11-04: qty 2

## 2019-11-04 MED ORDER — SODIUM CHLORIDE 0.9 % IV BOLUS
1000.0000 mL | Freq: Once | INTRAVENOUS | Status: AC
  Administered 2019-11-04: 1000 mL via INTRAVENOUS

## 2019-11-04 MED ORDER — SODIUM CHLORIDE 0.9 % IV SOLN: INTRAVENOUS | Status: DC

## 2019-11-04 MED ORDER — ONDANSETRON 8 MG PO TBDP: 8.0000 mg | ORAL_TABLET | Freq: Three times a day (TID) | ORAL | 0 refills | Status: DC | PRN

## 2019-11-04 NOTE — ED Triage Notes (Signed)
Pt BIBA from home-  Per EMS- Pt reports discomfort all night Pt c/o n/v/d abdominal pain. x1 hour; pt reports seeing blood in vomit.  4 mg Zofran- 18 g L AC AOx4 Ambulatory

## 2019-11-04 NOTE — Discharge Instructions (Addendum)
Take the medications as needed for nausea.  Return to the ED for worsening symptoms, pain in the right lower abdomen, fever.  The symptoms should resolve over the next day or two
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
## 2019-11-04 NOTE — ED Notes (Signed)
Discharge paperwork reviewed with pt, including prescription.  Pt with no questions or concerns at this time.

## 2020-08-15 ENCOUNTER — Emergency Department (HOSPITAL_COMMUNITY)
Admission: EM | Admit: 2020-08-15 | Discharge: 2020-08-15 | Disposition: A | Discharge status: Home / Self Care | Payer: BC Managed Care – PPO | Attending: Emergency Medicine | Admitting: Emergency Medicine

## 2020-08-15 ENCOUNTER — Other Ambulatory Visit: Payer: Self-pay

## 2020-08-15 ENCOUNTER — Emergency Department (HOSPITAL_COMMUNITY): Payer: BC Managed Care – PPO

## 2020-08-15 ENCOUNTER — Encounter (HOSPITAL_COMMUNITY): Payer: Self-pay

## 2020-08-15 DIAGNOSIS — U071 COVID-19: Secondary | ICD-10-CM | POA: Diagnosis not present

## 2020-08-15 DIAGNOSIS — Z28310 Unvaccinated for covid-19: Secondary | ICD-10-CM | POA: Insufficient documentation

## 2020-08-15 DIAGNOSIS — R112 Nausea with vomiting, unspecified: Secondary | ICD-10-CM | POA: Diagnosis present

## 2020-08-15 DIAGNOSIS — R197 Diarrhea, unspecified: Secondary | ICD-10-CM

## 2020-08-15 LAB — CBC WITH DIFFERENTIAL/PLATELET
Abs Immature Granulocytes: 0.08 10*3/uL — ABNORMAL HIGH (ref 0.00–0.07)
Basophils Absolute: 0 10*3/uL (ref 0.0–0.1)
Basophils Relative: 0 %
Eosinophils Absolute: 0.1 10*3/uL (ref 0.0–0.5)
Eosinophils Relative: 1 %
HCT: 38.9 % — ABNORMAL LOW (ref 39.0–52.0)
Hemoglobin: 13.5 g/dL (ref 13.0–17.0)
Immature Granulocytes: 1 %
Lymphocytes Relative: 4 %
Lymphs Abs: 0.5 10*3/uL — ABNORMAL LOW (ref 0.7–4.0)
MCH: 32.2 pg (ref 26.0–34.0)
MCHC: 34.7 g/dL (ref 30.0–36.0)
MCV: 92.8 fL (ref 80.0–100.0)
Monocytes Absolute: 0.8 10*3/uL (ref 0.1–1.0)
Monocytes Relative: 6 %
Neutro Abs: 10.8 10*3/uL — ABNORMAL HIGH (ref 1.7–7.7)
Neutrophils Relative %: 88 %
Platelets: 124 10*3/uL — ABNORMAL LOW (ref 150–400)
RBC: 4.19 MIL/uL — ABNORMAL LOW (ref 4.22–5.81)
RDW: 12.7 % (ref 11.5–15.5)
WBC: 12.2 10*3/uL — ABNORMAL HIGH (ref 4.0–10.5)
nRBC: 0 % (ref 0.0–0.2)

## 2020-08-15 LAB — COMPREHENSIVE METABOLIC PANEL
ALT: 20 U/L (ref 0–44)
AST: 20 U/L (ref 15–41)
Albumin: 4.1 g/dL (ref 3.5–5.0)
Alkaline Phosphatase: 47 U/L (ref 38–126)
Anion gap: 9 (ref 5–15)
BUN: 11 mg/dL (ref 6–20)
CO2: 23 mmol/L (ref 22–32)
Calcium: 9.3 mg/dL (ref 8.9–10.3)
Chloride: 106 mmol/L (ref 98–111)
Creatinine, Ser: 1.19 mg/dL (ref 0.61–1.24)
GFR, Estimated: >60 mL/min (ref >60)
Glucose, Bld: 132 mg/dL — ABNORMAL HIGH (ref 70–99)
Potassium: 3.7 mmol/L (ref 3.5–5.1)
Sodium: 138 mmol/L (ref 135–145)
Total Bilirubin: 0.6 mg/dL (ref 0.3–1.2)
Total Protein: 6.3 g/dL — ABNORMAL LOW (ref 6.5–8.1)

## 2020-08-15 LAB — LIPASE, BLOOD: Lipase: 39 U/L (ref 11–51)

## 2020-08-15 LAB — RESP PANEL BY RT-PCR (FLU A&B, COVID) ARPGX2
Influenza A by PCR: NEGATIVE
Influenza B by PCR: NEGATIVE
SARS Coronavirus 2 by RT PCR: POSITIVE — AB

## 2020-08-15 MED ORDER — SODIUM CHLORIDE 0.9 % IV BOLUS
1000.0000 mL | Freq: Once | INTRAVENOUS | Status: AC
  Administered 2020-08-15: 1000 mL via INTRAVENOUS

## 2020-08-15 MED ORDER — DICYCLOMINE HCL 20 MG PO TABS: 20.0000 mg | ORAL_TABLET | Freq: Two times a day (BID) | ORAL | 0 refills | Status: DC | PRN

## 2020-08-15 MED ORDER — DICYCLOMINE HCL 10 MG/ML IM SOLN
20.0000 mg | Freq: Once | INTRAMUSCULAR | Status: AC
  Administered 2020-08-15: 20 mg via INTRAMUSCULAR
  Filled 2020-08-15: qty 2

## 2020-08-15 MED ORDER — ONDANSETRON HCL 4 MG PO TABS: 4.0000 mg | ORAL_TABLET | Freq: Three times a day (TID) | ORAL | 0 refills | Status: DC | PRN

## 2020-08-15 MED ORDER — IBUPROFEN 800 MG PO TABS
800.0000 mg | ORAL_TABLET | Freq: Once | ORAL | Status: AC
  Administered 2020-08-15: 800 mg via ORAL
  Filled 2020-08-15: qty 1

## 2020-08-15 MED ORDER — ONDANSETRON HCL 4 MG/2ML IJ SOLN
4.0000 mg | Freq: Once | INTRAMUSCULAR | Status: AC
  Administered 2020-08-15: 4 mg via INTRAVENOUS
  Filled 2020-08-15: qty 2

## 2020-08-15 MED ORDER — DOXYCYCLINE HYCLATE 100 MG PO CAPS: 100.0000 mg | ORAL_CAPSULE | Freq: Two times a day (BID) | ORAL | 0 refills | Status: DC

## 2020-08-15 NOTE — Discharge Instructions (Addendum)
You will need to isolate for 5 days from today but may return to normal activity after 5 days if your symptoms are improving and you are not running a fever. Please return to the emergency department if you are having intractable vomiting that does not improve with medications, severe pain, shortness of breath.  You may use over-the-counter cold medicines if you develop any respiratory symptoms.

## 2020-08-15 NOTE — ED Triage Notes (Signed)
Pt c/o N/V diarrhea, generalized abdominal pain, and HA; sudden onset at 0400 this am; denies known sick contacts

## 2020-08-15 NOTE — ED Notes (Signed)
Patient verbalizes understanding of discharge instructions. Opportunity for questioning and answers were provided. Pt discharged from ED. 
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
## 2020-08-25 ENCOUNTER — Other Ambulatory Visit: Payer: Self-pay

## 2020-08-25 ENCOUNTER — Emergency Department (HOSPITAL_COMMUNITY)
Admission: EM | Admit: 2020-08-25 | Discharge: 2020-08-25 | Disposition: A | Discharge status: Home / Self Care | Payer: BC Managed Care – PPO | Attending: Emergency Medicine | Admitting: Emergency Medicine

## 2020-08-25 ENCOUNTER — Encounter (HOSPITAL_COMMUNITY): Payer: Self-pay

## 2020-08-25 DIAGNOSIS — H8110 Benign paroxysmal vertigo, unspecified ear: Secondary | ICD-10-CM | POA: Diagnosis not present

## 2020-08-25 DIAGNOSIS — U071 COVID-19: Secondary | ICD-10-CM | POA: Diagnosis not present

## 2020-08-25 DIAGNOSIS — J3489 Other specified disorders of nose and nasal sinuses: Secondary | ICD-10-CM | POA: Diagnosis not present

## 2020-08-25 DIAGNOSIS — R42 Dizziness and giddiness: Secondary | ICD-10-CM | POA: Diagnosis present

## 2020-08-25 LAB — BASIC METABOLIC PANEL
Anion gap: 6 (ref 5–15)
BUN: 10 mg/dL (ref 6–20)
CO2: 26 mmol/L (ref 22–32)
Calcium: 9.1 mg/dL (ref 8.9–10.3)
Chloride: 108 mmol/L (ref 98–111)
Creatinine, Ser: 1.14 mg/dL (ref 0.61–1.24)
GFR, Estimated: >60 mL/min (ref >60)
Glucose, Bld: 92 mg/dL (ref 70–99)
Potassium: 4.3 mmol/L (ref 3.5–5.1)
Sodium: 140 mmol/L (ref 135–145)

## 2020-08-25 LAB — CBC
HCT: 40.7 % (ref 39.0–52.0)
Hemoglobin: 14 g/dL (ref 13.0–17.0)
MCH: 32.1 pg (ref 26.0–34.0)
MCHC: 34.4 g/dL (ref 30.0–36.0)
MCV: 93.3 fL (ref 80.0–100.0)
Platelets: 172 10*3/uL (ref 150–400)
RBC: 4.36 MIL/uL (ref 4.22–5.81)
RDW: 12.7 % (ref 11.5–15.5)
WBC: 6.4 10*3/uL (ref 4.0–10.5)
nRBC: 0 % (ref 0.0–0.2)

## 2020-08-25 LAB — CBG MONITORING, ED: Glucose-Capillary: 96 mg/dL (ref 70–99)

## 2020-08-25 MED ORDER — MECLIZINE HCL 25 MG PO TABS
25.0000 mg | ORAL_TABLET | Freq: Once | ORAL | Status: AC
  Administered 2020-08-25: 25 mg via ORAL
  Filled 2020-08-25: qty 1

## 2020-08-25 MED ORDER — CETIRIZINE HCL 10 MG PO TABS: 10.0000 mg | ORAL_TABLET | Freq: Every day | ORAL | 0 refills | Status: AC

## 2020-08-25 MED ORDER — FLUTICASONE PROPIONATE 50 MCG/ACT NA SUSP: 2.0000 | Freq: Every day | NASAL | 0 refills | Status: AC

## 2020-08-25 MED ORDER — MECLIZINE HCL 25 MG PO TABS: 25.0000 mg | ORAL_TABLET | Freq: Three times a day (TID) | ORAL | 0 refills | Status: DC | PRN

## 2020-08-25 NOTE — Discharge Instructions (Signed)
Use meclizine as needed for dizziness.  You have a clear effusion on your right ear that is likely contributing to the symptoms, this is very common after viral infection with congestion.  Take Sudafed regularly and use Zyrtec and Flonase to help with decongestion.  You can also perform the Epley maneuvers as directed in your paperwork today.  If dizziness is not improving please follow-up with ENT.

## 2020-08-25 NOTE — ED Notes (Signed)
Patient states he was dx. With covid 5/10 c/o dizziness. He is wanting a note for being out of work.
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
## 2020-08-25 NOTE — ED Triage Notes (Signed)
Pt is here d/t dizziness that has been going on ever since he was diagnosed with covid 2 weeks ago. Pt c/o feeling dizzy and lightheaded.  Pt denies falls. Vitals are stable.

## 2022-01-21 ENCOUNTER — Other Ambulatory Visit: Payer: Self-pay

## 2022-01-21 ENCOUNTER — Other Ambulatory Visit (HOSPITAL_BASED_OUTPATIENT_CLINIC_OR_DEPARTMENT_OTHER): Payer: Self-pay

## 2022-01-21 ENCOUNTER — Encounter (HOSPITAL_BASED_OUTPATIENT_CLINIC_OR_DEPARTMENT_OTHER): Payer: Self-pay | Admitting: Emergency Medicine

## 2022-01-21 ENCOUNTER — Emergency Department (HOSPITAL_BASED_OUTPATIENT_CLINIC_OR_DEPARTMENT_OTHER)
Admission: EM | Admit: 2022-01-21 | Discharge: 2022-01-21 | Disposition: A | Discharge status: Home / Self Care | Payer: BC Managed Care – PPO | Attending: Emergency Medicine | Admitting: Emergency Medicine

## 2022-01-21 DIAGNOSIS — R1310 Dysphagia, unspecified: Secondary | ICD-10-CM | POA: Insufficient documentation

## 2022-01-21 DIAGNOSIS — J029 Acute pharyngitis, unspecified: Secondary | ICD-10-CM

## 2022-01-21 DIAGNOSIS — R509 Fever, unspecified: Secondary | ICD-10-CM | POA: Insufficient documentation

## 2022-01-21 DIAGNOSIS — Z20822 Contact with and (suspected) exposure to covid-19: Secondary | ICD-10-CM | POA: Insufficient documentation

## 2022-01-21 DIAGNOSIS — R5381 Other malaise: Secondary | ICD-10-CM | POA: Insufficient documentation

## 2022-01-21 DIAGNOSIS — R519 Headache, unspecified: Secondary | ICD-10-CM | POA: Insufficient documentation

## 2022-01-21 LAB — CBC WITH DIFFERENTIAL/PLATELET
Abs Immature Granulocytes: 0.03 10*3/uL (ref 0.00–0.07)
Basophils Absolute: 0 10*3/uL (ref 0.0–0.1)
Basophils Relative: 0 %
Eosinophils Absolute: 0.1 10*3/uL (ref 0.0–0.5)
Eosinophils Relative: 1 %
HCT: 38.8 % — ABNORMAL LOW (ref 39.0–52.0)
Hemoglobin: 13.4 g/dL (ref 13.0–17.0)
Immature Granulocytes: 0 %
Lymphocytes Relative: 19 %
Lymphs Abs: 1.7 10*3/uL (ref 0.7–4.0)
MCH: 32.3 pg (ref 26.0–34.0)
MCHC: 34.5 g/dL (ref 30.0–36.0)
MCV: 93.5 fL (ref 80.0–100.0)
Monocytes Absolute: 0.7 10*3/uL (ref 0.1–1.0)
Monocytes Relative: 8 %
Neutro Abs: 6.4 10*3/uL (ref 1.7–7.7)
Neutrophils Relative %: 72 %
Platelets: 129 10*3/uL — ABNORMAL LOW (ref 150–400)
RBC: 4.15 MIL/uL — ABNORMAL LOW (ref 4.22–5.81)
RDW: 12.8 % (ref 11.5–15.5)
WBC: 8.9 10*3/uL (ref 4.0–10.5)
nRBC: 0 % (ref 0.0–0.2)

## 2022-01-21 LAB — BASIC METABOLIC PANEL
Anion gap: 4 — ABNORMAL LOW (ref 5–15)
BUN: 8 mg/dL (ref 6–20)
CO2: 30 mmol/L (ref 22–32)
Calcium: 9.4 mg/dL (ref 8.9–10.3)
Chloride: 107 mmol/L (ref 98–111)
Creatinine, Ser: 1.19 mg/dL (ref 0.61–1.24)
GFR, Estimated: >60 mL/min (ref >60)
Glucose, Bld: 97 mg/dL (ref 70–99)
Potassium: 4.3 mmol/L (ref 3.5–5.1)
Sodium: 141 mmol/L (ref 135–145)

## 2022-01-21 LAB — RESP PANEL BY RT-PCR (FLU A&B, COVID) ARPGX2
Influenza A by PCR: NEGATIVE
Influenza B by PCR: NEGATIVE
SARS Coronavirus 2 by RT PCR: NEGATIVE

## 2022-01-21 LAB — GROUP A STREP BY PCR: Group A Strep by PCR: NOT DETECTED

## 2022-01-21 LAB — MONONUCLEOSIS SCREEN: Mono Screen: NEGATIVE

## 2022-01-21 MED ORDER — DOXYCYCLINE HYCLATE 100 MG PO CAPS
100.0000 mg | ORAL_CAPSULE | Freq: Two times a day (BID) | ORAL | 0 refills | Status: DC
  Filled 2022-01-21: qty 20, 10d supply, fill #0

## 2022-01-21 MED ORDER — CEFTRIAXONE SODIUM 500 MG IJ SOLR
500.0000 mg | Freq: Once | INTRAMUSCULAR | Status: AC
  Administered 2022-01-21: 500 mg via INTRAMUSCULAR
  Filled 2022-01-21: qty 500

## 2022-01-21 MED ORDER — LIDOCAINE HCL (PF) 1 % IJ SOLN
INTRAMUSCULAR | Status: AC
  Administered 2022-01-21: 1 mL
  Filled 2022-01-21: qty 5

## 2022-01-21 NOTE — ED Provider Notes (Signed)
[facility] EMERGENCY DEPARTMENT Provider Note   CSN: [id number] Arrival date & time: 01/21/22  1157     History  Chief Complaint  Patient presents with   Sore Throat    Curtis [name] is a 31 y.o. male.   Sore Throat     31 year old male presenting to the emerged part with sore throat and fever for the past 3 days.  The patient states ECAs had a bilateral pharyngitis with pain on swallowing.  He denies any unilateral nature to the pain.  He also states that he has had subjective fevers and warmth at home and wakes up in a cold sweat at night.  He states that 3 weeks prior to arrival he was bit by a tick.  He denies any migratory rash that involve the palms and soles but stated that he had a fever during that time.  He was not seen for this.  He endorses malaise.  Denies any abdominal pain, nausea or vomiting.  He endorses a mild headache.  Denies any neck stiffness.  Home Medications Prior to Admission medications   Medication Sig Start Date End Date Taking? Authorizing Provider  doxycycline (VIBRAMYCIN) 100 MG capsule Take 1 capsule (100 mg total) by mouth 2 (two) times daily. 01/21/22  Yes Ernie Avena, MD  cetirizine (ZYRTEC ALLERGY) 10 MG tablet Take 1 tablet (10 mg total) by mouth daily. 08/25/20   Dartha Lodge, PA-C  dicyclomine (BENTYL) 20 MG tablet Take 1 tablet (20 mg total) by mouth 2 (two) times daily as needed (abdominal pain and cramping). 08/15/20   Arthor Captain, PA-C  DULoxetine (CYMBALTA) 30 MG capsule Take 1 capsule (30 mg total) by mouth daily. Take 1 capsule for 7 days; then increase to 2 capsules a day 01/02/18   Drema Dallas, DO  DULoxetine (CYMBALTA) 60 MG capsule Take 1 capsule daily for 7 days, then increase to 2 capsules daily 01/14/18   Drema Dallas, DO  fluticasone (FLONASE) 50 MCG/ACT nasal spray Place 2 sprays into both nostrils daily. 08/25/20   Dartha Lodge, PA-C  meclizine (ANTIVERT) 25 MG tablet Take 1 tablet (25 mg total) by mouth 3  (three) times daily as needed for dizziness. 08/25/20   Dartha Lodge, PA-C  ondansetron (ZOFRAN) 4 MG tablet Take 1 tablet (4 mg total) by mouth every 8 (eight) hours as needed for nausea or vomiting. 08/15/20   Arthor Captain, PA-C  pantoprazole (PROTONIX) 20 MG tablet Take 1 tablet (20 mg total) by mouth daily. 07/22/19 08/21/19  Tanda Rockers, PA-C      Allergies    Cymbalta [duloxetine hcl]    Review of Systems   Review of Systems  All other systems reviewed and are negative.   Physical Exam Updated Vital Signs BP 117/80   Pulse (!) 57   Temp 99 F (37.2 C)   Resp 18   Ht 5\' 7"  (1.702 m)   Wt 70.8 kg   SpO2 100%   BMI 24.43 kg/m  Physical Exam Vitals and nursing note reviewed.  Constitutional:      General: He is not in acute distress.    Appearance: He is well-developed.  HENT:     Head: Normocephalic and atraumatic.     Mouth/Throat:     Pharynx: Oropharyngeal exudate and posterior oropharyngeal erythema present.     Tonsils: Tonsillar exudate present. No tonsillar abscesses. 2+ on the right. 2+ on the left.  Eyes:     Conjunctiva/sclera: Conjunctivae  normal.  Cardiovascular:     Rate and Rhythm: Normal rate and regular rhythm.  Pulmonary:     Effort: Pulmonary effort is normal. No respiratory distress.     Breath sounds: Normal breath sounds.  Abdominal:     Palpations: Abdomen is soft.     Tenderness: There is no abdominal tenderness.  Musculoskeletal:        General: No swelling.     Cervical back: Neck supple.  Lymphadenopathy:     Cervical: Cervical adenopathy present.  Skin:    General: Skin is warm and dry.     Capillary Refill: Capillary refill takes less than 2 seconds.     Findings: No rash.     Comments: No rash  Neurological:     Mental Status: He is alert.  Psychiatric:        Mood and Affect: Mood normal.     ED Results / Procedures / Treatments   Labs (all labs ordered are listed, but only abnormal results are displayed) Labs  Reviewed  CBC WITH DIFFERENTIAL/PLATELET - Abnormal; Notable for the following components:      Result Value   RBC 4.15 (*)    HCT 38.8 (*)    Platelets 129 (*)    All other components within normal limits  BASIC METABOLIC PANEL - Abnormal; Notable for the following components:   Anion gap 4 (*)    All other components within normal limits  GROUP A STREP BY PCR  RESP PANEL BY RT-PCR (FLU A&B, COVID) ARPGX2  MONONUCLEOSIS SCREEN  ROCKY MTN SPOTTED FVR ABS PNL(IGG+IGM)  GC/CHLAMYDIA PROBE AMP ([facility]) NOT AT [facility]    EKG None  Radiology No results found.  Procedures Procedures    Medications Ordered in ED Medications  cefTRIAXone (ROCEPHIN) injection 500 mg (500 mg Intramuscular Given 01/21/22 1455)  lidocaine (PF) (XYLOCAINE) 1 % injection (1 mL  Given 01/21/22 1501)    ED Course/ Medical Decision Making/ A&P                           Medical Decision Making Amount and/or Complexity of Data Reviewed Labs: ordered.  Risk Prescription drug management.     31 year old male presenting to the emerged part with sore throat and fever for the past 3 days.  The patient states ECAs had a bilateral pharyngitis with pain on swallowing.  He denies any unilateral nature to the pain.  He also states that he has had subjective fevers and warmth at home and wakes up in a cold sweat at night.  He states that 3 weeks prior to arrival he was bit by a tick.  He denies any migratory rash that involve the palms and soles but stated that he had a fever during that time.  He was not seen for this.  He endorses malaise.  Denies any abdominal pain, nausea or vomiting.  He endorses a mild headache.  Denies any neck stiffness.  On arrival, the patient was vitally stable, afebrile, not tachycardic or tachypneic BP 119/73, saturating 100% on room air, sinus rhythm noted on cardiac telemetry.  Physical exam significant for bilateral pharyngitis with cervical adenopathy with tonsillar exudate  without evidence of PTA.  Good range of motion of the neck, low suspicion for PTA or RPA.  No evidence for Ludwig's Angina.  Patient presenting with a bilateral pharyngitis for the past 3 days.  Differential diagnosis include strep pharyngitis, COVID-19, influenza, mononucleosis, other viral URI.  Additionally, the patient had tick exposure and has had subjective fevers and night sweats, consider [place] spotted fever. Patient also consents to testing for GC/chlamydia testing as gonococcal pharyngitis also is on the differential.  Laboratory testing significant for CBC without a leukocytosis or anemia, BMP unremarkable, mononucleosis screen negative, COVID-19 and influenza PCR testing negative, group A strep not detected.  Given the possibility of gonococcal pharyngitis, also considered recommended spotted fever although less likely given duration and lack of rash, will treat with a course of IM Rocephin for gonococcal pharyngitis and doxycycline.  Patient was provided with return precautions.  He was advised that results will be available on the patient portal in the next few days.  Stable for discharge.   Final Clinical Impression(s) / ED Diagnoses Final diagnoses:  Acute pharyngitis, unspecified etiology    Rx / DC Orders ED Discharge Orders          Ordered    doxycycline (VIBRAMYCIN) 100 MG capsule  2 times daily        01/21/22 1444              Regan Lemming, MD 01/22/22 [phone]

## 2022-01-21 NOTE — Discharge Instructions (Addendum)
Your COVID, flu, mono testing was negative and your group A strep testing was also negative.  You do not have an elevated white count.  Your gonorrhea and Chlamydia testing usually takes 2 days to result so we will empirically cover you for this today.  [facility] spotted fever testing also takes time to result.  Treatment for both chlamydia and recommended spotted fever is the same with doxycycline orally.  Treatment for gonorrhea is a shot of Rocephin which she received today in the ED.  Return for any worsening of your symptoms, difficulty swallowing or breathing, worsening pain, fever despite oral antibiotics as this could indicate a spread of infection in your neck.

## 2022-01-21 NOTE — ED Triage Notes (Signed)
Pt arrives pov, steady gait, c/o sore throat and cough x 2 days. Pt endorses ticks x 3 weeks pta. Pt reports HA, states "I had tick fever"

## 2022-01-22 LAB — GC/CHLAMYDIA PROBE AMP (~~LOC~~) NOT AT ARMC
Chlamydia: NEGATIVE
Comment: NEGATIVE
Comment: NORMAL
Neisseria Gonorrhea: NEGATIVE

## 2022-01-22 LAB — ROCKY MTN SPOTTED FVR ABS PNL(IGG+IGM)
RMSF IgG: NEGATIVE
RMSF IgM: 0.74 index (ref 0.00–0.89)

## 2022-04-20 ENCOUNTER — Encounter (HOSPITAL_BASED_OUTPATIENT_CLINIC_OR_DEPARTMENT_OTHER): Payer: Self-pay | Admitting: Emergency Medicine

## 2022-04-20 ENCOUNTER — Other Ambulatory Visit: Payer: Self-pay

## 2022-04-20 ENCOUNTER — Emergency Department (HOSPITAL_BASED_OUTPATIENT_CLINIC_OR_DEPARTMENT_OTHER)
Admission: EM | Admit: 2022-04-20 | Discharge: 2022-04-20 | Disposition: A | Discharge status: Home / Self Care | Payer: Self-pay | Attending: Emergency Medicine | Admitting: Emergency Medicine

## 2022-04-20 DIAGNOSIS — T1501XA Foreign body in cornea, right eye, initial encounter: Secondary | ICD-10-CM | POA: Insufficient documentation

## 2022-04-20 DIAGNOSIS — X58XXXA Exposure to other specified factors, initial encounter: Secondary | ICD-10-CM | POA: Insufficient documentation

## 2022-04-20 MED ORDER — TETRACAINE HCL 0.5 % OP SOLN
2.0000 [drp] | Freq: Once | OPHTHALMIC | Status: AC
  Administered 2022-04-20: 2 [drp] via OPHTHALMIC
  Filled 2022-04-20: qty 4

## 2022-04-20 MED ORDER — FLUORESCEIN SODIUM 1 MG OP STRP
1.0000 | ORAL_STRIP | Freq: Once | OPHTHALMIC | Status: AC
  Administered 2022-04-20: 1 via OPHTHALMIC
  Filled 2022-04-20: qty 1

## 2022-04-20 NOTE — ED Notes (Signed)
RN provided AVS using Teachback Method. Patient verbalizes understanding of Discharge Instructions. Opportunity for Questioning and Answers were provided by RN. Patient Discharged from ED ambulatory to [facility] for Further Care.

## 2022-04-20 NOTE — Discharge Instructions (Signed)
You need to leave here and go to the [place] [facility] as listed.  You will be seeing Dr. Manuella Ghazi who can remove the foreign body from your eye.  His cell phone number is [phone]

## 2022-04-20 NOTE — ED Notes (Signed)
Pt states he's able to see, it just feels like something in his eyes.

## 2022-04-20 NOTE — ED Provider Notes (Signed)
[place] EMERGENCY DEPT Provider Note   CSN: [id number] Arrival date & time: 04/20/22  7322     History  Chief Complaint  Patient presents with   Eye Pain    Curtis [name] is a 32 y.o. male.  He has no past medical history presents the ER with foreign body sensation to the right eye.  He was grinding metal on a walk yesterday and felt a foreign body go into his eye and has had some irritation since that time.  Denies blurry vision.  He denies significant pain just as sensation of foreign body.   Eye Pain       Home Medications Prior to Admission medications   Medication Sig Start Date End Date Taking? Authorizing Provider  cetirizine (ZYRTEC ALLERGY) 10 MG tablet Take 1 tablet (10 mg total) by mouth daily. 08/25/20   Jacqlyn Larsen, PA-C  dicyclomine (BENTYL) 20 MG tablet Take 1 tablet (20 mg total) by mouth 2 (two) times daily as needed (abdominal pain and cramping). 08/15/20   Margarita Mail, PA-C  doxycycline (VIBRAMYCIN) 100 MG capsule Take 1 capsule (100 mg total) by mouth 2 (two) times daily. 01/21/22   Regan Lemming, MD  DULoxetine (CYMBALTA) 30 MG capsule Take 1 capsule (30 mg total) by mouth daily. Take 1 capsule for 7 days; then increase to 2 capsules a day 01/02/18   Pieter Partridge, DO  DULoxetine (CYMBALTA) 60 MG capsule Take 1 capsule daily for 7 days, then increase to 2 capsules daily 01/14/18   Pieter Partridge, DO  fluticasone (FLONASE) 50 MCG/ACT nasal spray Place 2 sprays into both nostrils daily. 08/25/20   Jacqlyn Larsen, PA-C  meclizine (ANTIVERT) 25 MG tablet Take 1 tablet (25 mg total) by mouth 3 (three) times daily as needed for dizziness. 08/25/20   Jacqlyn Larsen, PA-C  ondansetron (ZOFRAN) 4 MG tablet Take 1 tablet (4 mg total) by mouth every 8 (eight) hours as needed for nausea or vomiting. 08/15/20   Margarita Mail, PA-C  pantoprazole (PROTONIX) 20 MG tablet Take 1 tablet (20 mg total) by mouth daily. 07/22/19 08/21/19  Eustaquio Maize, PA-C       Allergies    Cymbalta [duloxetine hcl]    Review of Systems   Review of Systems  Eyes:  Positive for pain.    Physical Exam Updated Vital Signs BP 114/76 (BP Location: Right Arm)   Pulse 96   Temp 98.5 F (36.9 C) (Oral)   Resp 18   Ht 5' 7.5" (1.715 m)   Wt 68 kg   SpO2 100%   BMI 23.15 kg/m  Physical Exam Vitals and nursing note reviewed.  Constitutional:      General: He is not in acute distress.    Appearance: He is well-developed.  HENT:     Head: Normocephalic and atraumatic.  Eyes:     Conjunctiva/sclera: Conjunctivae normal.     Pupils: Pupils are equal, round, and reactive to light.     Comments: There is conjunctival injection temporal aspect right eye.  Punctate foreign body noted approximately 930 on the cornea on the very edge of the iris.  Negative Seidel test  Cardiovascular:     Rate and Rhythm: Normal rate and regular rhythm.     Heart sounds: No murmur heard. Pulmonary:     Effort: Pulmonary effort is normal. No respiratory distress.     Breath sounds: Normal breath sounds.  Abdominal:     Palpations: Abdomen is soft.  Tenderness: There is no abdominal tenderness.  Musculoskeletal:        General: No swelling.     Cervical back: Neck supple.  Skin:    General: Skin is warm and dry.     Capillary Refill: Capillary refill takes less than 2 seconds.  Neurological:     Mental Status: He is alert.  Psychiatric:        Mood and Affect: Mood normal.     ED Results / Procedures / Treatments   Labs (all labs ordered are listed, but only abnormal results are displayed) Labs Reviewed - No data to display  EKG None  Radiology No results found.  Procedures Procedures    Medications Ordered in ED Medications  fluorescein ophthalmic strip 1 strip (1 strip Left Eye Given 04/20/22 1132)  tetracaine (PONTOCAINE) 0.5 % ophthalmic solution 2 drop (2 drops Left Eye Given 04/20/22 1132)    ED Course/ Medical Decision Making/ A&P                              Medical Decision Making This patient presents to the ED for concern of right eye foreign body sensation, this involves an extensive number of treatment options, and is a complaint that carries with it a high risk of complications and morbidity.  The differential diagnosis includes corneal abrasion, corneal foreign body, other      Consultations Obtained:  I requested consultation with the Ophthalmologist Dr. Manuella Ghazi,  and discussed lab and imaging findings as well as pertinent plan - they recommend: Can go to clinic now and he will remove the foreign body   Problem List / ED Course / Critical interventions / Medication management  Corneal foreign body-patient will be seen by Dr. Manuella Ghazi in the office upon discharge.  Patient states he has transportation and is agreeable with this I ordered medication including tetracaine for local anesthesia Reevaluation of the patient after these medicines showed that the patient improved I have reviewed the patients home medicines and have made adjustments as needed             Final Clinical Impression(s) / ED Diagnoses Final diagnoses:  Foreign body of right cornea, initial encounter    Rx / DC Orders ED Discharge Orders     None         Gwenevere Abbot, PA-C 04/20/22 1224    Fredia Sorrow, MD 04/21/22 0725

## 2022-04-20 NOTE — ED Triage Notes (Signed)
Pt via pov from home with eye injury yesterday. He reports that he was cutting a lock and feels like he has metal in his eye. Pt has very red right eye. Pt alert & oriented; nad noted.

## 2022-08-26 ENCOUNTER — Emergency Department (HOSPITAL_BASED_OUTPATIENT_CLINIC_OR_DEPARTMENT_OTHER): Payer: 59 | Admitting: Radiology

## 2022-08-26 ENCOUNTER — Emergency Department (HOSPITAL_BASED_OUTPATIENT_CLINIC_OR_DEPARTMENT_OTHER)
Admission: EM | Admit: 2022-08-26 | Discharge: 2022-08-26 | Disposition: A | Discharge status: Home / Self Care | Payer: 59 | Attending: Emergency Medicine | Admitting: Emergency Medicine

## 2022-08-26 ENCOUNTER — Other Ambulatory Visit: Payer: Self-pay

## 2022-08-26 ENCOUNTER — Encounter (HOSPITAL_BASED_OUTPATIENT_CLINIC_OR_DEPARTMENT_OTHER): Payer: Self-pay | Admitting: Emergency Medicine

## 2022-08-26 DIAGNOSIS — M545 Low back pain, unspecified: Secondary | ICD-10-CM | POA: Diagnosis not present

## 2022-08-26 DIAGNOSIS — R202 Paresthesia of skin: Secondary | ICD-10-CM | POA: Insufficient documentation

## 2022-08-26 DIAGNOSIS — S39012A Strain of muscle, fascia and tendon of lower back, initial encounter: Secondary | ICD-10-CM

## 2022-08-26 DIAGNOSIS — M542 Cervicalgia: Secondary | ICD-10-CM | POA: Diagnosis not present

## 2022-08-26 DIAGNOSIS — S3992XA Unspecified injury of lower back, initial encounter: Secondary | ICD-10-CM | POA: Diagnosis not present

## 2022-08-26 MED ORDER — LIDOCAINE 5 % EX PTCH: 1.0000 | MEDICATED_PATCH | CUTANEOUS | 0 refills | Status: AC

## 2022-08-26 MED ORDER — IBUPROFEN 600 MG PO TABS: 600.0000 mg | ORAL_TABLET | Freq: Four times a day (QID) | ORAL | 0 refills | Status: DC | PRN

## 2022-08-26 MED ORDER — IBUPROFEN 600 MG PO TABS: 600.0000 mg | ORAL_TABLET | Freq: Four times a day (QID) | ORAL | 0 refills | Status: AC | PRN

## 2022-08-26 MED ORDER — CYCLOBENZAPRINE HCL 10 MG PO TABS: 10.0000 mg | ORAL_TABLET | Freq: Three times a day (TID) | ORAL | 0 refills | Status: DC | PRN

## 2022-08-26 MED ORDER — LIDOCAINE 5 % EX PTCH: 1.0000 | MEDICATED_PATCH | CUTANEOUS | 0 refills | Status: DC

## 2022-08-26 MED ORDER — CYCLOBENZAPRINE HCL 10 MG PO TABS: 10.0000 mg | ORAL_TABLET | Freq: Three times a day (TID) | ORAL | 0 refills | Status: AC | PRN
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
## 2022-08-26 NOTE — ED Notes (Signed)
Pt given discharge instructions and reviewed prescriptions. Opportunities given for questions. Pt verbalizes understanding. Margret Moat R, RN 
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
## 2022-08-26 NOTE — Discharge Instructions (Signed)
Back Pain:  Back pain is very common.  The pain often gets better over time.  The cause of back pain is usually not dangerous.  Most people can learn to manage their back pain on their own.  Please follow up with your doctor as discussed for a recheck if still having symptoms. I provided 2 primary care clinics you may call to schedule a follow up appointment or you may go to a PCP of your own choosing. I also provided information for orthopedics or the bone specialists who may be able to help long-term with your back pain if it continues.   Low back pain is discomfort in the lower back that may be due to injuries to muscles and ligaments around the spine.  Occasionally, it may be caused by a a problem to a part of the spine called a disc. The pain may last several days or longer;  However, most patients get fully recover within 4 weeks.  Please use Tylenol or ibuprofen for pain.  You may use 600 mg ibuprofen every 6-8 hours or 1000 mg of Tylenol every 6 hours.  You may choose to alternate between the 2.  This would be most effective.  Do not exceed 4000 mg of Tylenol within 24 hours.  Do not exceed 3200 mg ibuprofen in 24 hours. It is recommended to take NSAIDs such as ibuprofen with food as they can cause stomach upset, and more seriously, stomach bleeding.     Home Care Stay active.  Start with short walks on flat ground if you can and/or back exercises as recommended.  Being sedentary can cause further tightening of your muscles and worsening or prolongation of your pain.  Do not sit, drive or stand in one place for more than 30 minutes.  Do not stay in bed. Be careful when you bend or lift an object.  Bend at your knees, keep the object close to you, and do not twist. Sleep on a firm mattress.  Lie on your side and bend your knees.  If you lie on your back, put a pillow under your knees. Only take medicines as recommended by your healthcare providers Put ice on the injured area in the first 2-3  days following an injury. Put ice in a plastic bag Place a towel between your skin and the bag Leave the ice on for 15-20 minutes, 3-4 times a day for the first 2-3 days. After that, you can switch between ice and heat packs whichever gives you the best relief. Massage therapy may also be beneficial. Avoid feeling anxious or stressed.  Find good ways to deal with stress, such as exercise.  Get Help Right Way If: Your pain does not go away with rest or medicine. Your pain does not go away in 1 week. You develop vomiting (throwing up) or diarrhea (loose poop) that is unusual for you The pain spreads into your legs. You have weakness in your legs You cannot control when you poop (bowel movement) or pee (urinate) You have belly (abdominal) pain. You have blood in your urine You feel like you may pass out (faint). If you develop a fever You have new, concerning symptoms.

## 2022-09-11 ENCOUNTER — Encounter (HOSPITAL_BASED_OUTPATIENT_CLINIC_OR_DEPARTMENT_OTHER): Payer: Self-pay | Admitting: Emergency Medicine

## 2022-09-11 ENCOUNTER — Emergency Department (HOSPITAL_BASED_OUTPATIENT_CLINIC_OR_DEPARTMENT_OTHER)
Admission: EM | Admit: 2022-09-11 | Discharge: 2022-09-11 | Discharge status: Against Medical Advice | Payer: 59 | Attending: Emergency Medicine | Admitting: Emergency Medicine

## 2022-09-11 ENCOUNTER — Other Ambulatory Visit: Payer: Self-pay

## 2022-09-11 ENCOUNTER — Emergency Department (HOSPITAL_BASED_OUTPATIENT_CLINIC_OR_DEPARTMENT_OTHER): Payer: 59

## 2022-09-11 DIAGNOSIS — Z5321 Procedure and treatment not carried out due to patient leaving prior to being seen by health care provider: Secondary | ICD-10-CM | POA: Diagnosis not present

## 2022-09-11 DIAGNOSIS — R109 Unspecified abdominal pain: Secondary | ICD-10-CM | POA: Insufficient documentation

## 2022-09-11 LAB — BASIC METABOLIC PANEL
Anion gap: 8 (ref 5–15)
BUN: 9 mg/dL (ref 6–20)
CO2: 28 mmol/L (ref 22–32)
Calcium: 9.3 mg/dL (ref 8.9–10.3)
Chloride: 102 mmol/L (ref 98–111)
Creatinine, Ser: 1.09 mg/dL (ref 0.61–1.24)
GFR, Estimated: >60 mL/min (ref >60)
Glucose, Bld: 86 mg/dL (ref 70–99)
Potassium: 3.6 mmol/L (ref 3.5–5.1)
Sodium: 138 mmol/L (ref 135–145)

## 2022-09-11 LAB — URINALYSIS, ROUTINE W REFLEX MICROSCOPIC
Bilirubin Urine: NEGATIVE
Glucose, UA: NEGATIVE mg/dL
Hgb urine dipstick: NEGATIVE
Ketones, ur: NEGATIVE mg/dL
Leukocytes,Ua: NEGATIVE
Nitrite: NEGATIVE
Protein, ur: NEGATIVE mg/dL
Specific Gravity, Urine: 1.008 (ref 1.005–1.030)
pH: 6.5 (ref 5.0–8.0)

## 2022-09-11 LAB — HEPATIC FUNCTION PANEL
ALT: 7 U/L (ref 0–44)
AST: 13 U/L — ABNORMAL LOW (ref 15–41)
Albumin: 4.8 g/dL (ref 3.5–5.0)
Alkaline Phosphatase: 43 U/L (ref 38–126)
Bilirubin, Direct: 0.2 mg/dL (ref 0.0–0.2)
Indirect Bilirubin: 0.5 mg/dL (ref 0.3–0.9)
Total Bilirubin: 0.7 mg/dL (ref 0.3–1.2)
Total Protein: 6.7 g/dL (ref 6.5–8.1)

## 2022-09-11 LAB — CBC
HCT: 39.5 % (ref 39.0–52.0)
Hemoglobin: 13.8 g/dL (ref 13.0–17.0)
MCH: 32.5 pg (ref 26.0–34.0)
MCHC: 34.9 g/dL (ref 30.0–36.0)
MCV: 92.9 fL (ref 80.0–100.0)
Platelets: 158 10*3/uL (ref 150–400)
RBC: 4.25 MIL/uL (ref 4.22–5.81)
RDW: 12.9 % (ref 11.5–15.5)
WBC: 9.1 10*3/uL (ref 4.0–10.5)
nRBC: 0 % (ref 0.0–0.2)

## 2022-09-11 LAB — LIPASE, BLOOD: Lipase: 20 U/L (ref 11–51)

## 2022-09-11 MED ORDER — SODIUM CHLORIDE 0.9 % IV BOLUS: 1000.0000 mL | Freq: Once | INTRAVENOUS | Status: DC

## 2022-09-11 MED ORDER — KETOROLAC TROMETHAMINE 15 MG/ML IJ SOLN
15.0000 mg | Freq: Once | INTRAMUSCULAR | Status: DC
  Filled 2022-09-11: qty 1

## 2022-09-11 NOTE — ED Triage Notes (Signed)
Pt arrives to ED with c/o intermittent right sided flank pain x3 days. Pt notes pain worsens when he urinates.
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
## 2023-04-30 ENCOUNTER — Encounter (HOSPITAL_BASED_OUTPATIENT_CLINIC_OR_DEPARTMENT_OTHER): Payer: Self-pay | Admitting: Emergency Medicine

## 2023-04-30 ENCOUNTER — Emergency Department (HOSPITAL_BASED_OUTPATIENT_CLINIC_OR_DEPARTMENT_OTHER)
Admission: EM | Admit: 2023-04-30 | Discharge: 2023-04-30 | Disposition: A | Discharge status: Home / Self Care | Payer: BLUE CROSS/BLUE SHIELD | Attending: Emergency Medicine | Admitting: Emergency Medicine

## 2023-04-30 ENCOUNTER — Other Ambulatory Visit: Payer: Self-pay

## 2023-04-30 DIAGNOSIS — Z20822 Contact with and (suspected) exposure to covid-19: Secondary | ICD-10-CM | POA: Diagnosis not present

## 2023-04-30 DIAGNOSIS — D696 Thrombocytopenia, unspecified: Secondary | ICD-10-CM | POA: Diagnosis not present

## 2023-04-30 DIAGNOSIS — R739 Hyperglycemia, unspecified: Secondary | ICD-10-CM

## 2023-04-30 DIAGNOSIS — R7309 Other abnormal glucose: Secondary | ICD-10-CM | POA: Insufficient documentation

## 2023-04-30 DIAGNOSIS — J101 Influenza due to other identified influenza virus with other respiratory manifestations: Secondary | ICD-10-CM | POA: Insufficient documentation

## 2023-04-30 DIAGNOSIS — R059 Cough, unspecified: Secondary | ICD-10-CM | POA: Diagnosis present

## 2023-04-30 DIAGNOSIS — R112 Nausea with vomiting, unspecified: Secondary | ICD-10-CM

## 2023-04-30 LAB — COMPREHENSIVE METABOLIC PANEL
ALT: 7 U/L (ref 0–44)
AST: 11 U/L — ABNORMAL LOW (ref 15–41)
Albumin: 4.5 g/dL (ref 3.5–5.0)
Alkaline Phosphatase: 46 U/L (ref 38–126)
Anion gap: 8 (ref 5–15)
BUN: 5 mg/dL — ABNORMAL LOW (ref 6–20)
CO2: 27 mmol/L (ref 22–32)
Calcium: 8.9 mg/dL (ref 8.9–10.3)
Chloride: 102 mmol/L (ref 98–111)
Creatinine, Ser: 1.18 mg/dL (ref 0.61–1.24)
GFR, Estimated: >60 mL/min (ref >60)
Glucose, Bld: 114 mg/dL — ABNORMAL HIGH (ref 70–99)
Potassium: 3.7 mmol/L (ref 3.5–5.1)
Sodium: 137 mmol/L (ref 135–145)
Total Bilirubin: 0.5 mg/dL (ref 0.0–1.2)
Total Protein: 6.4 g/dL — ABNORMAL LOW (ref 6.5–8.1)

## 2023-04-30 LAB — CBC WITH DIFFERENTIAL/PLATELET
Abs Immature Granulocytes: 0.02 10*3/uL (ref 0.00–0.07)
Basophils Absolute: 0 10*3/uL (ref 0.0–0.1)
Basophils Relative: 0 %
Eosinophils Absolute: 0.2 10*3/uL (ref 0.0–0.5)
Eosinophils Relative: 2 %
HCT: 38.6 % — ABNORMAL LOW (ref 39.0–52.0)
Hemoglobin: 13.5 g/dL (ref 13.0–17.0)
Immature Granulocytes: 0 %
Lymphocytes Relative: 5 %
Lymphs Abs: 0.4 10*3/uL — ABNORMAL LOW (ref 0.7–4.0)
MCH: 33.1 pg (ref 26.0–34.0)
MCHC: 35 g/dL (ref 30.0–36.0)
MCV: 94.6 fL (ref 80.0–100.0)
Monocytes Absolute: 1 10*3/uL (ref 0.1–1.0)
Monocytes Relative: 12 %
Neutro Abs: 6.9 10*3/uL (ref 1.7–7.7)
Neutrophils Relative %: 81 %
Platelets: 116 10*3/uL — ABNORMAL LOW (ref 150–400)
RBC: 4.08 MIL/uL — ABNORMAL LOW (ref 4.22–5.81)
RDW: 13.2 % (ref 11.5–15.5)
WBC: 8.6 10*3/uL (ref 4.0–10.5)
nRBC: 0 % (ref 0.0–0.2)

## 2023-04-30 LAB — RESP PANEL BY RT-PCR (RSV, FLU A&B, COVID)  RVPGX2
Influenza A by PCR: POSITIVE — AB
Influenza B by PCR: NEGATIVE
Resp Syncytial Virus by PCR: NEGATIVE
SARS Coronavirus 2 by RT PCR: NEGATIVE

## 2023-04-30 MED ORDER — LOPERAMIDE HCL 2 MG PO CAPS
4.0000 mg | ORAL_CAPSULE | Freq: Once | ORAL | Status: AC
  Administered 2023-04-30: 4 mg via ORAL
  Filled 2023-04-30: qty 2

## 2023-04-30 MED ORDER — ONDANSETRON HCL 4 MG/2ML IJ SOLN
4.0000 mg | Freq: Once | INTRAMUSCULAR | Status: AC
  Administered 2023-04-30: 4 mg via INTRAVENOUS
  Filled 2023-04-30: qty 2

## 2023-04-30 MED ORDER — SODIUM CHLORIDE 0.9 % IV BOLUS
1000.0000 mL | Freq: Once | INTRAVENOUS | Status: AC
  Administered 2023-04-30: 1000 mL via INTRAVENOUS

## 2023-04-30 MED ORDER — ONDANSETRON 4 MG PO TBDP: 4.0000 mg | ORAL_TABLET | Freq: Three times a day (TID) | ORAL | 0 refills | Status: DC | PRN

## 2023-04-30 NOTE — ED Provider Notes (Signed)
[place] EMERGENCY DEPARTMENT AT [facility] Provider Note   CSN: [id number] Arrival date & time: 04/30/23  4098     History  Chief Complaint  Patient presents with   Cough   Emesis    Curtis [name] is a 33 y.o. male.  The history is provided by the patient.  Cough Emesis Associated symptoms: cough   He had a onset yesterday of fever to 103, chills, nonproductive cough, nausea, vomiting, diarrhea, body aches.  He had just returned from a trip to [facility] but does not recall specific exposure with somebody ill.  He has felt dizzy when he stands up.  He tried taking a pill for nausea, but vomited after taking it.   Home Medications Prior to Admission medications   Medication Sig Start Date End Date Taking? Authorizing Provider  cetirizine (ZYRTEC ALLERGY) 10 MG tablet Take 1 tablet (10 mg total) by mouth daily. 08/25/20   Rosezella Rumpf, PA-C  dicyclomine (BENTYL) 20 MG tablet Take 1 tablet (20 mg total) by mouth 2 (two) times daily as needed (abdominal pain and cramping). 08/15/20   Arthor Captain, PA-C  doxycycline (VIBRAMYCIN) 100 MG capsule Take 1 capsule (100 mg total) by mouth 2 (two) times daily. 01/21/22   Ernie Avena, MD  DULoxetine (CYMBALTA) 30 MG capsule Take 1 capsule (30 mg total) by mouth daily. Take 1 capsule for 7 days; then increase to 2 capsules a day 01/02/18   Drema Dallas, DO  DULoxetine (CYMBALTA) 60 MG capsule Take 1 capsule daily for 7 days, then increase to 2 capsules daily 01/14/18   Drema Dallas, DO  fluticasone (FLONASE) 50 MCG/ACT nasal spray Place 2 sprays into both nostrils daily. 08/25/20   Rosezella Rumpf, PA-C  meclizine (ANTIVERT) 25 MG tablet Take 1 tablet (25 mg total) by mouth 3 (three) times daily as needed for dizziness. 08/25/20   Rosezella Rumpf, PA-C  ondansetron (ZOFRAN) 4 MG tablet Take 1 tablet (4 mg total) by mouth every 8 (eight) hours as needed for nausea or vomiting. 08/15/20   Arthor Captain, PA-C  pantoprazole  (PROTONIX) 20 MG tablet Take 1 tablet (20 mg total) by mouth daily. 07/22/19 08/21/19  Tanda Rockers, PA-C      Allergies    Cymbalta [duloxetine hcl]    Review of Systems   Review of Systems  Respiratory:  Positive for cough.   Gastrointestinal:  Positive for vomiting.  All other systems reviewed and are negative.   Physical Exam Updated Vital Signs BP 115/73 (BP Location: Right Arm)   Pulse 75   Temp 99.4 F (37.4 C) (Oral)   Resp 19   Ht 5\' 7"  (1.702 m)   Wt 68 kg   SpO2 99%   BMI 23.49 kg/m  Physical Exam Vitals and nursing note reviewed.   33 year old male, resting comfortably and in no acute distress. Vital signs are normal. Oxygen saturation is 99%, which is normal. Head is normocephalic and atraumatic. PERRLA, EOMI. Oropharynx is clear. Neck is nontender and supple without adenopathy . Lungs are clear without rales, wheezes, or rhonchi. Chest is nontender. Heart has regular rate and rhythm without murmur. Abdomen is soft, flat, nontender. Extremities have no edema, full range of motion is present. Skin is warm and dry without rash. Neurologic: Mental status is normal, cranial nerves are intact, moves all extremities equally.  ED Results / Procedures / Treatments   Labs (all labs ordered are listed, but only abnormal results are  displayed) Labs Reviewed  RESP PANEL BY RT-PCR (RSV, FLU A&B, COVID)  RVPGX2 - Abnormal; Notable for the following components:      Result Value   Influenza A by PCR POSITIVE (*)    All other components within normal limits  COMPREHENSIVE METABOLIC PANEL - Abnormal; Notable for the following components:   Glucose, Bld 114 (*)    BUN 5 (*)    Total Protein 6.4 (*)    AST 11 (*)    All other components within normal limits  CBC WITH DIFFERENTIAL/PLATELET - Abnormal; Notable for the following components:   RBC 4.08 (*)    HCT 38.6 (*)    Platelets 116 (*)    Lymphs Abs 0.4 (*)    All other components within normal limits     Procedures Procedures    Medications Ordered in ED Medications  sodium chloride 0.9 % bolus 1,000 mL (1,000 mLs Intravenous New Bag/Given 04/30/23 0539)  ondansetron (ZOFRAN) injection 4 mg (4 mg Intravenous Given 04/30/23 0539)  loperamide (IMODIUM) capsule 4 mg (4 mg Oral Given 04/30/23 0536)    ED Course/ Medical Decision Making/ A&P                                 Medical Decision Making Amount and/or Complexity of Data Reviewed Labs: ordered.  Risk Prescription drug management.   Symptom complex of cough, rhinorrhea, fever, chills, vomiting, diarrhea strongly suggestive of viral illness such as COVID-19.  Consider influenza.  I suspect that he has some degree of dehydration.  I have ordered IV fluids as well as laboratory workup of CBC, comprehensive metabolic panel and respiratory pathogen PCR panel.  He feels better after above-noted treatment.  I reviewed his laboratory test, and my interpretation is mildly elevated random glucose which will need to be followed as an outpatient, thrombocytopenia which had been present previously and is in a similar range that he has been at in the past, positive PCR for influenza A.  Since he has influenza and arrived in the treatment window where antiviral medication can be helpful, I have offered him a prescription for oseltamivir, but he has declined based on the risk of side effects of vomiting and diarrhea which she is already experiencing.  I am discharging him with prescription for ondansetron oral dissolving tablet and I have recommended he use over-the-counter loperamide as needed for diarrhea.  Final Clinical Impression(s) / ED Diagnoses Final diagnoses:  Influenza A  Thrombocytopenia (HCC)  Nausea vomiting and diarrhea  Elevated random blood glucose level    Rx / DC Orders ED Discharge Orders          Ordered    ondansetron (ZOFRAN-ODT) 4 MG disintegrating tablet  Every 8 hours PRN        04/30/23 8469               Dione Booze, MD 04/30/23 [phone]

## 2023-04-30 NOTE — ED Triage Notes (Signed)
Pt report productive cough, temp 103 this morning, chills, NVD x 2 days. Attempted to take OTC cold and flu medicine around 3 am - unable to tolerate PO.

## 2023-04-30 NOTE — Discharge Instructions (Addendum)
Drink plenty of fluids.  Take ibuprofen and/or acetaminophen as needed for fever or aching.  Please be aware that if you combine ibuprofen and acetaminophen, you will get better pain control and better fever control then if you take either medication by itself.  Take loperamide (Imodium A-D) as needed for diarrhea.

## 2023-04-30 NOTE — ED Notes (Signed)
ED Provider at bedside. 

## 2023-11-17 ENCOUNTER — Other Ambulatory Visit: Payer: Self-pay

## 2023-11-17 ENCOUNTER — Emergency Department (HOSPITAL_BASED_OUTPATIENT_CLINIC_OR_DEPARTMENT_OTHER)
Admission: EM | Admit: 2023-11-17 | Discharge: 2023-11-17 | Discharge status: Against Medical Advice | Payer: Self-pay | Attending: Emergency Medicine | Admitting: Emergency Medicine

## 2023-11-17 ENCOUNTER — Encounter (HOSPITAL_BASED_OUTPATIENT_CLINIC_OR_DEPARTMENT_OTHER): Payer: Self-pay

## 2023-11-17 ENCOUNTER — Emergency Department (HOSPITAL_BASED_OUTPATIENT_CLINIC_OR_DEPARTMENT_OTHER): Payer: Self-pay

## 2023-11-17 DIAGNOSIS — M79672 Pain in left foot: Secondary | ICD-10-CM | POA: Insufficient documentation

## 2023-11-17 DIAGNOSIS — Z5321 Procedure and treatment not carried out due to patient leaving prior to being seen by health care provider: Secondary | ICD-10-CM | POA: Insufficient documentation

## 2023-11-17 DIAGNOSIS — W12XXXA Fall on and from scaffolding, initial encounter: Secondary | ICD-10-CM | POA: Insufficient documentation

## 2023-11-17 DIAGNOSIS — S61011A Laceration without foreign body of right thumb without damage to nail, initial encounter: Secondary | ICD-10-CM | POA: Insufficient documentation

## 2023-11-17 NOTE — ED Notes (Signed)
 Pt states he is leaving

## 2023-11-17 NOTE — ED Triage Notes (Signed)
 Pt fell off of scaffolding around 10 am. He hurt his left foot and cut his right thumb. He worked the rest of the day but now it is throbbing.

## 2023-11-21 ENCOUNTER — Emergency Department (HOSPITAL_BASED_OUTPATIENT_CLINIC_OR_DEPARTMENT_OTHER): Admitting: Radiology

## 2023-11-21 ENCOUNTER — Other Ambulatory Visit: Payer: Self-pay

## 2023-11-21 ENCOUNTER — Emergency Department (HOSPITAL_BASED_OUTPATIENT_CLINIC_OR_DEPARTMENT_OTHER)
Admission: EM | Admit: 2023-11-21 | Discharge: 2023-11-21 | Disposition: A | Discharge status: Home / Self Care | Attending: Emergency Medicine | Admitting: Emergency Medicine

## 2023-11-21 DIAGNOSIS — M79672 Pain in left foot: Secondary | ICD-10-CM | POA: Diagnosis present

## 2023-11-21 DIAGNOSIS — M545 Low back pain, unspecified: Secondary | ICD-10-CM | POA: Insufficient documentation

## 2023-11-21 MED ORDER — METHOCARBAMOL 500 MG PO TABS: 1000.0000 mg | ORAL_TABLET | Freq: Three times a day (TID) | ORAL | 0 refills | Status: AC | PRN

## 2023-11-21 MED ORDER — MELOXICAM 7.5 MG PO TABS: 7.5000 mg | ORAL_TABLET | Freq: Every day | ORAL | 0 refills | Status: DC

## 2023-11-21 NOTE — ED Triage Notes (Addendum)
 States fell 25 feet off ladder on Monday. Seen here on same day but left without being seen d/t wait times.  C/o left foot and lower back pain. Ambulatory.   Had negative xray of foot on 11/17/23.

## 2023-11-21 NOTE — Discharge Instructions (Signed)
 Please read and follow all provided instructions.  Your diagnoses today include:  1. Acute left-sided low back pain without sciatica   2. Left foot pain     Tests performed today include: Vital signs - see below for your results today X-ray of the lower back: Was normal  Medications prescribed:  Robaxin  (methocarbamol ) - muscle relaxer medication  DO NOT drive or perform any activities that require you to be awake and alert because this medicine can make you drowsy.   Meloxicam  - anti-inflammatory pain medication  You have been prescribed an anti-inflammatory medication or NSAID. Take with food. Do not take aspirin, ibuprofen , or naproxen  if taking this medication. Take smallest effective dose for the shortest duration needed for your pain. Stop taking if you experience stomach pain or vomiting.   Take any prescribed medications only as directed.  Home care instructions:  Follow any educational materials contained in this packet Please rest, use ice or heat on your back for the next several days Do not lift, push, pull anything more than 10 pounds for the next week  Follow-up instructions: Please follow-up with your primary care provider with the orthopedist listed in the next 1 week for further evaluation of your symptoms if not improving.  Return instructions:  SEEK IMMEDIATE MEDICAL ATTENTION IF YOU HAVE: New numbness, tingling, weakness, or problem with the use of your arms or legs Severe back pain not relieved with medications Loss control of your bowels or bladder Increasing pain in any areas of the body (such as chest or abdominal pain) Shortness of breath, dizziness, or fainting.  Worsening nausea (feeling sick to your stomach), vomiting, fever, or sweats Any other emergent concerns regarding your health   Additional Information:  Your vital signs today were: BP 123/77 (BP Location: Right Arm)   Pulse 87   Temp 98.4 F (36.9 C) (Oral)   Resp 18   SpO2 100%  If  your blood pressure (BP) was elevated above 135/85 this visit, please have this repeated by your doctor within one month. --------------

## 2023-11-21 NOTE — ED Provider Notes (Signed)
 [place] EMERGENCY DEPARTMENT AT [facility] Provider Note   CSN: [id number] Arrival date & time: 11/21/23  9061     Patient presents with: Curtis [name] is a 33 y.o. male.   Patient presents to the emergency department today for evaluation of ongoing pain.  Patient fell about 10 feet off of a scaffolding 4 days ago.  Patient was seen in the emergency department but left without being seen.  Patient is unable to say exactly how he landed.  He had pain in his left foot.  X-ray was negative.  Since that time he has also had pain in his lower back, favoring the left side, with radiation into the hip.  Not improved with previously prescribed cyclobenzaprine .  Pain is worse with walking and movement.  He denies urinary or fecal incontinence.  No unexplained fevers or weight loss.  No headache or neck pain.  No numbness, tingling, weakness in the arms or the legs --except for over the volar surface of the right thumb where patient sustained a deep cut.  Here he has change of sensation, no weakness.  His wound has been healing without signs of infection.       Prior to Admission medications   Medication Sig Start Date End Date Taking? Authorizing Provider  cetirizine  (ZYRTEC  ALLERGY) 10 MG tablet Take 1 tablet (10 mg total) by mouth daily. 08/25/20   Alva Larraine FALCON, PA-C  DULoxetine  (CYMBALTA ) 30 MG capsule Take 1 capsule (30 mg total) by mouth daily. Take 1 capsule for 7 days; then increase to 2 capsules a day 01/02/18   Skeet Juliene SAUNDERS, DO  DULoxetine  (CYMBALTA ) 60 MG capsule Take 1 capsule daily for 7 days, then increase to 2 capsules daily 01/14/18   Skeet Juliene SAUNDERS, DO  fluticasone  (FLONASE ) 50 MCG/ACT nasal spray Place 2 sprays into both nostrils daily. 08/25/20   Kehrli, Kelsey F, PA-C  meclizine  (ANTIVERT ) 25 MG tablet Take 1 tablet (25 mg total) by mouth 3 (three) times daily as needed for dizziness. 08/25/20   Kehrli, Kelsey F, PA-C  ondansetron  (ZOFRAN -ODT) 4 MG  disintegrating tablet Take 1 tablet (4 mg total) by mouth every 8 (eight) hours as needed for nausea or vomiting. 04/30/23   Raford Lenis, MD  pantoprazole  (PROTONIX ) 20 MG tablet Take 1 tablet (20 mg total) by mouth daily. 07/22/19 08/21/19  Shepard Clinch, PA-C    Allergies: Cymbalta  [duloxetine  hcl]    Review of Systems  Updated Vital Signs BP 123/77 (BP Location: Right Arm)   Pulse 87   Temp 98.4 F (36.9 C) (Oral)   Resp 18   SpO2 100%   Physical Exam Vitals and nursing note reviewed.  Constitutional:      Appearance: He is well-developed.  HENT:     Head: Normocephalic and atraumatic.  Eyes:     Conjunctiva/sclera: Conjunctivae normal.  Cardiovascular:     Pulses: Normal pulses. No decreased pulses.  Musculoskeletal:        General: Tenderness present.     Right hand: No tenderness. Normal range of motion. Normal strength. Decreased sensation.     Cervical back: Normal range of motion and neck supple. No tenderness.     Thoracic back: No tenderness.     Lumbar back: Tenderness and bony tenderness present.       Back:     Right knee: Normal range of motion. No tenderness.     Left knee: Normal range of motion. No tenderness.  Right lower leg: No edema.     Left lower leg: No edema.     Left ankle: No tenderness. Normal range of motion.     Left foot: Normal capillary refill. Tenderness present. No bony tenderness.       Legs:     Comments: Patient with tenderness to palpation over the distal portion of the metatarsals of the left foot.  No significant swelling, ecchymosis.  Patient with reported sensation change, not true numbness, to the volar aspect of the right thumb, distal to a 1 cm laceration, transverse.  This wound is healing without signs of swelling, redness.  Wound is closed.  Patient is able to move thumb in all degrees of freedom without any difficulties.  No obvious weakness to passive resistance.  Skin:    General: Skin is warm and dry.   Neurological:     Mental Status: He is alert.     Sensory: No sensory deficit.     Comments: Motor, sensation, and vascular distal to the injury is fully intact.   Psychiatric:        Mood and Affect: Mood normal.     (all labs ordered are listed, but only abnormal results are displayed) Labs Reviewed - No data to display  EKG: None  Radiology: DG Lumbar Spine Complete Result Date: 11/21/2023 EXAM: 4 VIEW(S) XRAY OF THE LUMBAR SPINE 11/21/2023 10:55:22 AM COMPARISON: 08/26/2022 CLINICAL HISTORY: Low back pain, fall 4 days ago. FINDINGS: LUMBAR SPINE: BONES: No acute fracture. No aggressive appearing osseous lesion. Alignment is normal. Maintained vertebral body heights. DISCS AND DEGENERATIVE CHANGES: No severe degenerative changes. Intervertebral disc heights maintained. SOFT TISSUES: No acute abnormality. IMPRESSION: 1. No acute abnormality of the lumbar spine. Electronically signed by: Rockey Kilts MD 11/21/2023 11:10 AM EDT RP Workstation: [id number]     Procedures   Medications Ordered in the ED - No data to display  ED Course  Patient seen and examined. History obtained directly from patient.  Fall 4 days ago.  He has low back pain.  Unclear exactly how he landed, but did hurt his foot.  Likely muscle pain and bruising, element of spasm.  Will ensure no signs of compression fracture with plain films.  Patient looks well.  Labs/EKG: None ordered  Imaging: Ordered plain films lumbar spine.  Medications/Fluids: None ordered  Most recent vital signs reviewed and are as follows: BP 123/77 (BP Location: Right Arm)   Pulse 87   Temp 98.4 F (36.9 C) (Oral)   Resp 18   SpO2 100%   Initial impression: Fall, left foot pain, lower back pain favoring the left side, right thumb laceration without weakness but does have likely superficial nerve injury causing sensation change.  Discussed unclear prognosis, likely will be slow to heal, unclear if sensation will go back to  normal.  11:43 AM Reassessment performed. Patient appears stable.  Imaging personally visualized and interpreted including: X-ray of the lumbar spine appears normal.  Reviewed pertinent lab work and imaging with patient at bedside. Questions answered.   Most current vital signs reviewed and are as follows: BP 123/77 (BP Location: Right Arm)   Pulse 87   Temp 98.4 F (36.9 C) (Oral)   Resp 18   SpO2 100%   Plan: Discharge to home.  Patient has postop shoe at home, encouraged to use if needed.  States that he was using yesterday.  Prescriptions written for: Robaxin , meloxicam   Other home care instructions discussed: RICE protocol  ED return instructions  discussed: New or worsening symptoms  Follow-up instructions discussed: Patient encouraged to follow-up with their PCP in 7 days.                                     Medical Decision Making Amount and/or Complexity of Data Reviewed Radiology: ordered.   Patient presents to the emergency department 4 days after a fall.  He had x-ray of his foot the other day which was negative.  Returns with continued lower back pain.  X-ray of the lumbar spine negative.  Patient is ambulatory.  No red flags of lower back pain.  Suspect musculoskeletal pain.  Do not feel that patient requires MRI at this time.  No neurological deficits. Patient is ambulatory. No warning symptoms of back pain including: fecal incontinence, urinary retention or overflow incontinence, night sweats, waking from sleep with back pain, unexplained fevers or weight loss, h/o cancer, IVDU, recent trauma. No concern for cauda equina, epidural abscess, or other serious cause of back pain. Conservative measures such as rest, ice/heat and pain medicine indicated with PCP follow-up if no improvement with conservative management.       Final diagnoses:  Acute left-sided low back pain without sciatica  Left foot pain    ED Discharge Orders          Ordered    meloxicam   (MOBIC ) 7.5 MG tablet  Daily        11/21/23 1143    methocarbamol  (ROBAXIN ) 500 MG tablet  Every 8 hours PRN        11/21/23 1143               Desiderio Chew, PA-C 11/21/23 1146    Armenta Canning, MD 12/02/23 1814

## 2024-04-15 ENCOUNTER — Encounter: Payer: Self-pay | Admitting: Nurse Practitioner

## 2024-04-15 ENCOUNTER — Ambulatory Visit: Admitting: Nurse Practitioner

## 2024-04-15 VITALS — BP 93/51 | HR 56 | Ht 67.0 in | Wt 144.4 lb

## 2024-04-15 DIAGNOSIS — R001 Bradycardia, unspecified: Secondary | ICD-10-CM

## 2024-04-15 DIAGNOSIS — Z7689 Persons encountering health services in other specified circumstances: Secondary | ICD-10-CM

## 2024-04-15 DIAGNOSIS — F419 Anxiety disorder, unspecified: Secondary | ICD-10-CM | POA: Diagnosis not present

## 2024-04-15 DIAGNOSIS — Z1329 Encounter for screening for other suspected endocrine disorder: Secondary | ICD-10-CM

## 2024-04-15 DIAGNOSIS — K5900 Constipation, unspecified: Secondary | ICD-10-CM | POA: Diagnosis not present

## 2024-04-15 DIAGNOSIS — F32A Depression, unspecified: Secondary | ICD-10-CM

## 2024-04-15 DIAGNOSIS — I959 Hypotension, unspecified: Secondary | ICD-10-CM

## 2024-04-15 MED ORDER — DOCUSATE SODIUM 100 MG PO CAPS: 100.0000 mg | ORAL_CAPSULE | Freq: Two times a day (BID) | ORAL | 0 refills | Status: AC
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
## 2024-04-16 LAB — COMPREHENSIVE METABOLIC PANEL WITH GFR
ALT: 9 IU/L (ref 0–44)
AST: 12 IU/L (ref 0–40)
Albumin: 4.1 g/dL (ref 4.1–5.1)
Alkaline Phosphatase: 63 IU/L (ref 47–123)
BUN/Creatinine Ratio: 13 (ref 9–20)
BUN: 14 mg/dL (ref 6–20)
Bilirubin Total: <0.2 mg/dL (ref 0.0–1.2)
CO2: 25 mmol/L (ref 20–29)
Calcium: 9.2 mg/dL (ref 8.7–10.2)
Chloride: 105 mmol/L (ref 96–106)
Creatinine, Ser: 1.11 mg/dL (ref 0.76–1.27)
Globulin, Total: 1.9 g/dL (ref 1.5–4.5)
Glucose: 90 mg/dL (ref 70–99)
Potassium: 4.3 mmol/L (ref 3.5–5.2)
Sodium: 143 mmol/L (ref 134–144)
Total Protein: 6 g/dL (ref 6.0–8.5)
eGFR: 90 mL/min/1.73

## 2024-04-16 LAB — CBC
Hematocrit: 40.1 % (ref 37.5–51.0)
Hemoglobin: 13.1 g/dL (ref 13.0–17.7)
MCH: 32 pg (ref 26.6–33.0)
MCHC: 32.7 g/dL (ref 31.5–35.7)
MCV: 98 fL — ABNORMAL HIGH (ref 79–97)
Platelets: 153 x10E3/uL (ref 150–450)
RBC: 4.1 x10E6/uL — ABNORMAL LOW (ref 4.14–5.80)
RDW: 13.2 % (ref 11.6–15.4)
WBC: 7.7 x10E3/uL (ref 3.4–10.8)

## 2024-04-16 LAB — TSH: TSH: 2.73 u[IU]/mL (ref 0.450–4.500)

## 2024-04-17 ENCOUNTER — Ambulatory Visit (INDEPENDENT_AMBULATORY_CARE_PROVIDER_SITE_OTHER): Payer: Self-pay

## 2024-04-20 ENCOUNTER — Ambulatory Visit (INDEPENDENT_AMBULATORY_CARE_PROVIDER_SITE_OTHER): Payer: Self-pay | Admitting: Licensed Clinical Social Worker

## 2024-04-20 DIAGNOSIS — F4310 Post-traumatic stress disorder, unspecified: Secondary | ICD-10-CM | POA: Diagnosis not present

## 2024-04-20 DIAGNOSIS — F411 Generalized anxiety disorder: Secondary | ICD-10-CM

## 2024-04-20 DIAGNOSIS — F332 Major depressive disorder, recurrent severe without psychotic features: Secondary | ICD-10-CM | POA: Diagnosis not present
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
## 2024-04-30 ENCOUNTER — Encounter: Payer: Self-pay | Admitting: Cardiology

## 2024-04-30 ENCOUNTER — Ambulatory Visit: Attending: Cardiology | Admitting: Cardiology

## 2024-04-30 VITALS — BP 110/70 | HR 53 | Ht 67.0 in | Wt 142.7 lb

## 2024-04-30 DIAGNOSIS — R001 Bradycardia, unspecified: Secondary | ICD-10-CM | POA: Insufficient documentation

## 2024-04-30 NOTE — Patient Instructions (Addendum)
 Medication Instructions:  No Change  Lab Work: None ordered  Testing/Procedures: None ordered  Follow-Up: At [facility], you and your health needs are our priority.  As part of our continuing mission to provide you with exceptional heart care, our providers are all part of one team.  This team includes your primary Cardiologist (physician) and Advanced Practice Providers or APPs (Physician Assistants and Nurse Practitioners) who all work together to provide you with the care you need, when you need it.  Your next appointment:  As Needed     Provider:  Dr.Jordan    We recommend signing up for the patient portal called MyChart.  Sign up information is provided on this After Visit Summary.  MyChart is used to connect with patients for Virtual Visits (Telemedicine).  Patients are able to view lab/test results, encounter notes, upcoming appointments, etc.  Non-urgent messages can be sent to your provider as well.   To learn more about what you can do with MyChart, go to [email or web address].

## 2024-05-03 ENCOUNTER — Telehealth (HOSPITAL_COMMUNITY): Payer: Self-pay | Admitting: Licensed Clinical Social Worker
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
## 2024-05-04 ENCOUNTER — Ambulatory Visit (HOSPITAL_COMMUNITY): Admitting: Licensed Clinical Social Worker

## 2024-05-04 DIAGNOSIS — F411 Generalized anxiety disorder: Secondary | ICD-10-CM

## 2024-05-04 DIAGNOSIS — F4310 Post-traumatic stress disorder, unspecified: Secondary | ICD-10-CM

## 2024-05-04 DIAGNOSIS — F332 Major depressive disorder, recurrent severe without psychotic features: Secondary | ICD-10-CM | POA: Diagnosis not present
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
## 2024-05-17 ENCOUNTER — Ambulatory Visit: Payer: Self-pay | Admitting: Nurse Practitioner

## 2024-05-27 ENCOUNTER — Ambulatory Visit: Payer: Self-pay | Admitting: Nurse Practitioner
